# Patient Record
Sex: Female | Born: 1995 | Hispanic: Yes | Marital: Married | Smoking: Never smoker
Health system: Southern US, Community
[De-identification: ages and names within clinical notes are randomized; demographics above are authoritative.]

## PROBLEM LIST (undated history)

## (undated) DIAGNOSIS — Z789 Other specified health status: Secondary | ICD-10-CM

## (undated) DIAGNOSIS — O139 Gestational [pregnancy-induced] hypertension without significant proteinuria, unspecified trimester: Secondary | ICD-10-CM

## (undated) HISTORY — PX: OTHER SURGICAL HISTORY: SHX169

## (undated) HISTORY — DX: Other specified health status: Z78.9

## (undated) HISTORY — PX: NO PAST SURGERIES: SHX2092

---

## 2020-02-19 DIAGNOSIS — F411 Generalized anxiety disorder: Secondary | ICD-10-CM | POA: Insufficient documentation

## 2022-01-06 DIAGNOSIS — Z6838 Body mass index (BMI) 38.0-38.9, adult: Secondary | ICD-10-CM | POA: Insufficient documentation

## 2022-01-06 HISTORY — DX: Body mass index (BMI) 38.0-38.9, adult: Z68.38

## 2022-01-17 ENCOUNTER — Encounter (HOSPITAL_COMMUNITY): Payer: Self-pay | Admitting: Obstetrics & Gynecology

## 2022-01-17 ENCOUNTER — Emergency Department (HOSPITAL_COMMUNITY)
Admission: EM | Admit: 2022-01-17 | Discharge: 2022-01-17 | Discharge status: Absent without leave | Payer: BC Managed Care – PPO | Source: Home / Self Care

## 2022-01-17 ENCOUNTER — Inpatient Hospital Stay (HOSPITAL_COMMUNITY)
Admission: AD | Admit: 2022-01-17 | Discharge: 2022-01-17 | Disposition: A | Discharge status: Home / Self Care | Payer: BC Managed Care – PPO | Attending: Obstetrics & Gynecology | Admitting: Obstetrics & Gynecology

## 2022-01-17 ENCOUNTER — Inpatient Hospital Stay (HOSPITAL_COMMUNITY): Payer: BC Managed Care – PPO

## 2022-01-17 DIAGNOSIS — O26891 Other specified pregnancy related conditions, first trimester: Secondary | ICD-10-CM

## 2022-01-17 DIAGNOSIS — R1032 Left lower quadrant pain: Secondary | ICD-10-CM | POA: Insufficient documentation

## 2022-01-17 DIAGNOSIS — R1030 Lower abdominal pain, unspecified: Secondary | ICD-10-CM

## 2022-01-17 DIAGNOSIS — N898 Other specified noninflammatory disorders of vagina: Secondary | ICD-10-CM | POA: Diagnosis not present

## 2022-01-17 DIAGNOSIS — Z3A13 13 weeks gestation of pregnancy: Secondary | ICD-10-CM | POA: Diagnosis not present

## 2022-01-17 LAB — OB RESULTS CONSOLE HEPATITIS B SURFACE ANTIGEN: Hepatitis B Surface Ag: NEGATIVE

## 2022-01-17 LAB — CBC
HCT: 36 % (ref 36.0–46.0)
Hemoglobin: 12.5 g/dL (ref 12.0–15.0)
MCH: 30.9 pg (ref 26.0–34.0)
MCHC: 34.7 g/dL (ref 30.0–36.0)
MCV: 88.9 fL (ref 80.0–100.0)
Platelets: 244 10*3/uL (ref 150–400)
RBC: 4.05 MIL/uL (ref 3.87–5.11)
RDW: 12.9 % (ref 11.5–15.5)
WBC: 9.1 10*3/uL (ref 4.0–10.5)
nRBC: 0 % (ref 0.0–0.2)

## 2022-01-17 LAB — URINALYSIS, ROUTINE W REFLEX MICROSCOPIC
Bilirubin Urine: NEGATIVE
Glucose, UA: NEGATIVE mg/dL
Hgb urine dipstick: NEGATIVE
Ketones, ur: NEGATIVE mg/dL
Leukocytes,Ua: NEGATIVE
Nitrite: NEGATIVE
Protein, ur: NEGATIVE mg/dL
Specific Gravity, Urine: 1.033 — ABNORMAL HIGH (ref 1.005–1.030)
pH: 5 (ref 5.0–8.0)

## 2022-01-17 LAB — POCT PREGNANCY, URINE: Preg Test, Ur: POSITIVE — AB

## 2022-01-17 LAB — WET PREP, GENITAL
Clue Cells Wet Prep HPF POC: NONE SEEN
Sperm: NONE SEEN
Trich, Wet Prep: NONE SEEN
WBC, Wet Prep HPF POC: <10 (ref <10)
Yeast Wet Prep HPF POC: NONE SEEN

## 2022-01-17 LAB — HCG, QUANTITATIVE, PREGNANCY: hCG, Beta Chain, Quant, S: 77758 m[IU]/mL — ABNORMAL HIGH (ref <5)

## 2022-01-17 LAB — ABO/RH: ABO/RH(D): O POS

## 2022-01-17 NOTE — MAU Note (Addendum)
Jodi Jenkins is a 26 y.o. at [redacted]w[redacted]d here in MAU reporting: abdominal cramping that radiates from the left lower quadrant to the middle that started at 0400 today and right upper back pain that started on Tuesday this week, patient describes it like pulling, pins and needles, and tingling. Patient denies having any vaginal bleeding, but does report having watery, clear vaginal discharge.   Pain score: 7 Vitals:   01/17/22 1829  BP: (!) 120/53  Pulse: 89  Temp: 98.8 F (37.1 C)  SpO2: 100%     Unable to obtain FHR with doppler, patient will have an ultrasound.

## 2022-01-17 NOTE — Discharge Instructions (Signed)

## 2022-01-17 NOTE — MAU Provider Note (Signed)
History     CSN: [id number]  Arrival date and time: 01/17/22 1800   Event Date/Time   First Provider Initiated Contact with Patient 01/17/22 1847      Chief Complaint  Patient presents with   Abdominal Cramping   Back Pain    Right upper back pain   Jodi Jenkins, a  26 y.o. G1P0 at [redacted]w[redacted]d presents to MAU with complaints of left sided lower abdominal cramping that has been going on since 0400 AM this morning. Patient states that she is "afraid to take anything" and has not attempted anything for pain relief. Currently rates pain a 6/10. She also endorses upper right shoulder pain with movement that started Tuesday. Describes as "pulling, pins and needles." She denies vaginal bleeding, but endorses increase in "watery" vaginal discharge since cramping this morning. Denies having to wear a pad or panty liner. States the discharge is colorless and odorless. Denies urinary symptoms. FHT were unable to be heard in triage and when CNM arrived to the room, patient was very tearful.   Patient receives care at [facility] in [place]. Desires to Transfer care to [facility]        OB History     Gravida  1   Para      Term      Preterm      AB      Living         SAB      IAB      Ectopic      Multiple      Live Births              History reviewed. No pertinent past medical history.  History reviewed. No pertinent surgical history.  Family History  Problem Relation Age of Onset   Hypertension Mother    Cancer Maternal Grandmother     Social History   Tobacco Use   Smoking status: Unknown  Vaping Use   Vaping Use: Never used  Substance Use Topics   Alcohol use: Never   Drug use: Never    Allergies: No Known Allergies  No medications prior to admission.    Review of Systems  Gastrointestinal:  Positive for abdominal pain. Negative for constipation, nausea and vomiting.  Genitourinary:  Positive for pelvic pain and vaginal discharge.  Negative for difficulty urinating, flank pain, vaginal bleeding and vaginal pain.  Musculoskeletal:  Negative for back pain.  Neurological:  Negative for light-headedness and headaches.   Physical Exam   Blood pressure 113/67, pulse 91, temperature 98.8 F (37.1 C), temperature source Oral, resp. rate 18, height 5\' 2"  (1.575 m), weight 100.4 kg, last menstrual period 10/01/2021, SpO2 100 %.  Physical Exam Vitals and nursing note reviewed.  Constitutional:      General: She is in acute distress.     Appearance: Normal appearance.  HENT:     Head: Normocephalic.  Pulmonary:     Effort: No respiratory distress.  Abdominal:     Tenderness: There is abdominal tenderness. There is no right CVA tenderness or left CVA tenderness.  Musculoskeletal:     Cervical back: Normal range of motion.  Skin:    General: Skin is warm and dry.  Neurological:     Mental Status: She is alert and oriented to person, place, and time.  Psychiatric:        Mood and Affect: Mood normal.     MAU Course  Procedures Lab Orders  Wet prep, genital         Urinalysis, Routine w reflex microscopic Urine, Clean Catch         CBC         hCG, quantitative, pregnancy         Pregnancy, urine POC    OB Ultrasound also ordered.   Results for orders placed or performed during the hospital encounter of 01/17/22 (from the past 24 hour(s))  Pregnancy, urine POC     Status: Abnormal   Collection Time: 01/17/22  6:16 PM  Result Value Ref Range   Preg Test, Ur POSITIVE (A) NEGATIVE  Urinalysis, Routine w reflex microscopic Urine, Clean Catch     Status: Abnormal   Collection Time: 01/17/22  6:26 PM  Result Value Ref Range   Color, Urine YELLOW YELLOW   APPearance HAZY (A) CLEAR   Specific Gravity, Urine 1.033 (H) 1.005 - 1.030   pH 5.0 5.0 - 8.0   Glucose, UA NEGATIVE NEGATIVE mg/dL   Hgb urine dipstick NEGATIVE NEGATIVE   Bilirubin Urine NEGATIVE NEGATIVE   Ketones, ur NEGATIVE NEGATIVE mg/dL    Protein, ur NEGATIVE NEGATIVE mg/dL   Nitrite NEGATIVE NEGATIVE   Leukocytes,Ua NEGATIVE NEGATIVE  Wet prep, genital     Status: None   Collection Time: 01/17/22  6:54 PM  Result Value Ref Range   Yeast Wet Prep HPF POC NONE SEEN NONE SEEN   Trich, Wet Prep NONE SEEN NONE SEEN   Clue Cells Wet Prep HPF POC NONE SEEN NONE SEEN   WBC, Wet Prep HPF POC <10 <10   Sperm NONE SEEN   CBC     Status: None   Collection Time: 01/17/22  7:29 PM  Result Value Ref Range   WBC 9.1 4.0 - 10.5 K/uL   RBC 4.05 3.87 - 5.11 MIL/uL   Hemoglobin 12.5 12.0 - 15.0 g/dL   HCT 32.4 40.1 - 02.7 %   MCV 88.9 80.0 - 100.0 fL   MCH 30.9 26.0 - 34.0 pg   MCHC 34.7 30.0 - 36.0 g/dL   RDW 25.3 66.4 - 40.3 %   Platelets 244 150 - 400 K/uL   nRBC 0.0 0.0 - 0.2 %  ABO/Rh     Status: None   Collection Time: 01/17/22  7:29 PM  Result Value Ref Range   ABO/RH(D)      O POS Performed at [facility], [address] [place], [place] [address]   hCG, quantitative, pregnancy     Status: Abnormal   Collection Time: 01/17/22  7:29 PM  Result Value Ref Range   hCG, Beta Chain, Quant, S 77,758 (H) <5 mIU/mL   US OB Comp Less 14 Wks  Result Date: 01/17/2022 CLINICAL DATA:  Pain left lower quadrant of abdomen EXAM: OBSTETRIC <14 WK ULTRASOUND TECHNIQUE: Transabdominal ultrasound was performed for evaluation of the gestation as well as the maternal uterus and adnexal regions. COMPARISON:  None Available. FINDINGS: Intrauterine gestational sac: Single Yolk sac:  Not seen Embryo:  Seen Cardiac Activity: Seen Heart Rate: 140 bpm CRL:   80 mm   14 w 0 d                  [place] EDC: 07/18/2022 Placenta is posterior without evidence of previa or abruption. Amount of amniotic fluid around the fetus appears to be normal. Subchorionic hemorrhage:  None visualized. Maternal uterus/adnexae: There is 3.5 cm cyst in the right ovary, possibly functional cyst. Adnexal regions  are otherwise unremarkable. There is no free fluid in the  pelvis. IMPRESSION: Single live intrauterine pregnancy seen. Sonographically estimated gestational age is 14 weeks. 3.5 cm cyst in the right adnexa suggests functional ovarian cyst. There is no free fluid in the pelvis. Electronically Signed   By: Ernie Avena M.D.   On: 01/17/2022 19:59    MDM Labs in MAU were normal.  Ultrasound revealed a normal IUP at ~[redacted] weeks gestation with active cardiac activity.  3.5cm cyst noted in the Right ovary. Low suspicion for threatened miscarriage.   Assessment and Plan   1. Vaginal discharge during pregnancy in first trimester   2. [redacted] weeks gestation of pregnancy   3. Lower abdominal pain    - Normal IUP ~14 weeks. Patient reassured of normal pregnancy and normal lab results.  - Discussed in detail options for pain management including PO Tylenol and Flexeril. Offered to patient, but patient declines at this time.  - Safe medications in pregnancy list provided.  - Discussed that pain may be related to Round ligament pain, or musculoskeletal pain. Stretches and options for comfort provided in discharge instructions. - Patient desires to switch to [facility] for prenatal care. Provider list given.  -  Preterm labor precautions provided.  - Patient discharged home in stable condition.  - Patient may return to MAU as needed.   Claudette Head, CNM  01/17/2022, 10:53 PM

## 2022-01-20 ENCOUNTER — Telehealth: Payer: Self-pay | Admitting: *Deleted

## 2022-01-20 NOTE — Telephone Encounter (Signed)
Left patient an urgent message to call the office prior to scheduled appointment on 02/13/2022 at 10:10 with a 9:55 AM arrival time.

## 2022-02-13 ENCOUNTER — Encounter: Payer: BC Managed Care – PPO | Admitting: Certified Nurse Midwife

## 2022-04-16 ENCOUNTER — Encounter: Payer: Self-pay | Admitting: *Deleted

## 2022-04-16 DIAGNOSIS — Z3490 Encounter for supervision of normal pregnancy, unspecified, unspecified trimester: Secondary | ICD-10-CM

## 2022-04-16 HISTORY — DX: Encounter for supervision of normal pregnancy, unspecified, unspecified trimester: Z34.90

## 2022-04-17 ENCOUNTER — Encounter: Payer: Self-pay | Admitting: Advanced Practice Midwife

## 2022-04-17 ENCOUNTER — Ambulatory Visit (INDEPENDENT_AMBULATORY_CARE_PROVIDER_SITE_OTHER): Payer: BC Managed Care – PPO | Admitting: Advanced Practice Midwife

## 2022-04-17 VITALS — BP 118/79 | HR 83 | Wt 243.0 lb

## 2022-04-17 DIAGNOSIS — O133 Gestational [pregnancy-induced] hypertension without significant proteinuria, third trimester: Secondary | ICD-10-CM | POA: Diagnosis not present

## 2022-04-17 DIAGNOSIS — M549 Dorsalgia, unspecified: Secondary | ICD-10-CM

## 2022-04-17 DIAGNOSIS — O99212 Obesity complicating pregnancy, second trimester: Secondary | ICD-10-CM | POA: Insufficient documentation

## 2022-04-17 DIAGNOSIS — Z3A26 26 weeks gestation of pregnancy: Secondary | ICD-10-CM

## 2022-04-17 DIAGNOSIS — O0993 Supervision of high risk pregnancy, unspecified, third trimester: Secondary | ICD-10-CM | POA: Diagnosis not present

## 2022-04-17 DIAGNOSIS — O0990 Supervision of high risk pregnancy, unspecified, unspecified trimester: Secondary | ICD-10-CM | POA: Insufficient documentation

## 2022-04-17 DIAGNOSIS — O99891 Other specified diseases and conditions complicating pregnancy: Secondary | ICD-10-CM

## 2022-04-17 DIAGNOSIS — O2602 Excessive weight gain in pregnancy, second trimester: Secondary | ICD-10-CM

## 2022-04-17 DIAGNOSIS — O0992 Supervision of high risk pregnancy, unspecified, second trimester: Secondary | ICD-10-CM

## 2022-04-17 DIAGNOSIS — Z3A35 35 weeks gestation of pregnancy: Secondary | ICD-10-CM | POA: Diagnosis not present

## 2022-04-17 NOTE — Progress Notes (Signed)
Subjective:   Jodi Jenkins is a 26 y.o. G1P0 at [redacted]w[redacted]d by early ultrasound being seen today for her first obstetrical visit.  Her obstetrical history is significant for excessive weight gain and has Supervision of high risk pregnancy, antepartum; GAD (generalized anxiety disorder); BMI 38.0-38.9,adult; and Obesity affecting pregnancy in second trimester on their problem list.. Patient does intend to breast feed. Pregnancy history fully reviewed.  Patient reports backache.  HISTORY: OB History  Gravida Para Term Preterm AB Living  1 0 0 0 0 0  SAB IAB Ectopic Multiple Live Births  0 0 0 0 0    # Outcome Date GA Lbr Len/2nd Weight Sex Delivery Anes PTL Lv  1 Current            Past Medical History:  Diagnosis Date  . Supervision of normal pregnancy 04/16/2022    Nursing Staff Provider Office Location   Dating  07/22/2022, by Other Basis PNC Model [ ]  Traditional [ ]  Centering [ ]  Mom-Baby Dyad Anatomy   Language    Flu Vaccine   Genetic/Carrier Screen  NIPS:  Neg  AFP: Neg   Horizon: TDaP Vaccine    Hgb A1C or  GTT Early  Third trimester  COVID Vaccine    LAB RESULTS  Rhogam     Blood Type   Opos Baby Feeding Plan  Antibody  Ne   History reviewed. No pertinent surgical history. Family History  Problem Relation Age of Onset  . Hypertension Mother   . Cancer Maternal Grandmother    Social History   Tobacco Use  . Smoking status: Unknown  Vaping Use  . Vaping Use: Never used  Substance Use Topics  . Alcohol use: Never  . Drug use: Never   No Known Allergies Current Outpatient Medications on File Prior to Visit  Medication Sig Dispense Refill  . aspirin EC 81 MG tablet Take 81 mg by mouth daily. Swallow whole.    . prenatal vitamin w/FE, FA (PRENATAL 1 + 1) 27-1 MG TABS tablet Take 1 tablet by mouth daily at 12 noon.     No current facility-administered medications on file prior to visit.    Exam   Vitals:   04/17/22 1034  BP: 118/79  Pulse: 83  Weight: 243  lb (110.2 kg)   Fetal Heart Rate (bpm): 143  VS reviewed, nursing note reviewed,  Constitutional: well developed, well nourished, no distress HEENT: normocephalic CV: normal rate Pulm/chest wall: normal effort Abdomen: soft Neuro: alert and oriented x 3 Skin: warm, dry Psych: affect normal    Assessment:   Pregnancy: G1P0 Patient Active Problem List   Diagnosis Date Noted  . Supervision of high risk pregnancy, antepartum 04/17/2022  . Obesity affecting pregnancy in second trimester 04/17/2022  . BMI 38.0-38.9,adult 01/06/2022  . GAD (generalized anxiety disorder) 02/19/2020     Plan:  1. Supervision of high risk pregnancy in second trimester -High risk for BMI --Anticipatory guidance about next visits/weeks of pregnancy given.   2. [redacted] weeks gestation of pregnancy   3. Obesity affecting pregnancy in second trimester --Taking BASA - 08-15-1974 MFM OB DETAIL +14 WK; Future  4. Excess weight gain in pregnancy, second trimester --Pt reports 40 lb weight gain at 26 weeks --Discussed dietary changes, increased lean meats and vegetables, decreased high calorie carbohydrate foods.  Add daily walk to schedule. ---Pt taking Phentermine before pregnancy and lost 40 lbs so thinks this may be weight gain after stopping medication  -  [place] MFM OB DETAIL +14 WK; Future  5. Back pain affecting pregnancy in second trimester --Rest/ice/heat/warm bath/increase PO fluids/Tylenol/pregnancy support belt      Initial labs drawn. Continue prenatal vitamins. Reviewed previous labs  Ultrasound discussed; fetal anatomic survey:  results reviewed and [place] ordered to follow growth for BMI . Problem list reviewed and updated. The nature of [facility] with multiple MDs and other Advanced Practice Providers was explained to patient; also emphasized that residents, students are part of our team. Routine obstetric precautions reviewed. Return in about 2 weeks (around  05/01/2022) for GTT at next visit, Any provider.   Sharen Counter, CNM 04/17/22 12:30 PM

## 2022-04-21 ENCOUNTER — Encounter (INDEPENDENT_AMBULATORY_CARE_PROVIDER_SITE_OTHER): Payer: Self-pay | Admitting: *Deleted

## 2022-04-21 NOTE — Progress Notes (Signed)
FMLA filled out and charges dropped

## 2022-04-22 ENCOUNTER — Telehealth: Payer: Self-pay | Admitting: *Deleted

## 2022-04-22 NOTE — Telephone Encounter (Signed)
Returned call from 11:14 AM. Left patient a message with Tea information for scheduling earlier appointment. Patient can call [facility] daily for cancellations.

## 2022-05-01 ENCOUNTER — Ambulatory Visit (INDEPENDENT_AMBULATORY_CARE_PROVIDER_SITE_OTHER): Payer: BC Managed Care – PPO | Admitting: Certified Nurse Midwife

## 2022-05-01 VITALS — BP 114/76 | HR 91 | Wt 245.0 lb

## 2022-05-01 DIAGNOSIS — O0992 Supervision of high risk pregnancy, unspecified, second trimester: Secondary | ICD-10-CM

## 2022-05-01 DIAGNOSIS — Z3A28 28 weeks gestation of pregnancy: Secondary | ICD-10-CM | POA: Diagnosis not present

## 2022-05-01 DIAGNOSIS — O0990 Supervision of high risk pregnancy, unspecified, unspecified trimester: Secondary | ICD-10-CM

## 2022-05-01 DIAGNOSIS — O99212 Obesity complicating pregnancy, second trimester: Secondary | ICD-10-CM | POA: Diagnosis not present

## 2022-05-01 LAB — OB RESULTS CONSOLE RUBELLA ANTIBODY, IGM: Rubella: IMMUNE

## 2022-05-01 NOTE — Progress Notes (Signed)
Subjective:  Jodi Jenkins is a 26 y.o. G1P0 at [redacted]w[redacted]d being seen today for ongoing prenatal care.  She is currently monitored for the following issues for this low-risk pregnancy and has Supervision of high risk pregnancy, antepartum; GAD (generalized anxiety disorder); BMI 38.0-38.9,adult; and Obesity affecting pregnancy in second trimester on their problem list.  Patient reports no complaints.  Contractions: Not present. Vag. Bleeding: None.  Movement: Present. Denies leaking of fluid.   The following portions of the patient's history were reviewed and updated as appropriate: allergies, current medications, past family history, past medical history, past social history, past surgical history and problem list. Problem list updated.  Objective:   Vitals:   05/01/22 0815  BP: 114/76  Pulse: 91  Weight: 245 lb (111.1 kg)    Fetal Status: Fetal Heart Rate (bpm): 159 Fundal Height: 31 cm Movement: Present     General:  Alert, oriented and cooperative. Patient is in no acute distress.  Skin: Skin is warm and dry. No rash noted.   Cardiovascular: Normal heart rate noted  Respiratory: Normal respiratory effort, no problems with respiration noted  Abdomen: Soft, gravid, appropriate for gestational age. Pain/Pressure: Absent     Pelvic: Vag. Bleeding: None Vag D/C Character: Thin   Cervical exam deferred        Extremities: Normal range of motion.  Edema: None  Mental Status: Normal mood and affect. Normal behavior. Normal judgment and thought content.   Urinalysis:      Assessment and Plan:  Pregnancy: G1P0 at [redacted]w[redacted]d  1. Supervision of high risk pregnancy, antepartum  - 2Hr GTT w/ 1 Hr Carpenter 75 g - HIV antibody (with reflex) - CBC - RPR  2. Obesity affecting pregnancy in third trimester -start AT at 32w  3. [redacted] weeks gestation of pregnancy   Preterm labor symptoms and general obstetric precautions including but not limited to vaginal bleeding, contractions, leaking of  fluid and fetal movement were reviewed in detail with the patient. Please refer to After Visit Summary for other counseling recommendations.  Return in about 2 weeks (around 05/15/2022).   Julianne Handler, CNM

## 2022-05-01 NOTE — Progress Notes (Signed)
Pt will do Tdap at next visit

## 2022-05-04 LAB — CBC
HCT: 37.3 % (ref 35.0–45.0)
Hemoglobin: 12.6 g/dL (ref 11.7–15.5)
MCH: 29.6 pg (ref 27.0–33.0)
MCHC: 33.8 g/dL (ref 32.0–36.0)
MCV: 87.8 fL (ref 80.0–100.0)
MPV: 11.1 fL (ref 7.5–12.5)
Platelets: 278 10*3/uL (ref 140–400)
RBC: 4.25 10*6/uL (ref 3.80–5.10)
RDW: 12.8 % (ref 11.0–15.0)
WBC: 7.5 10*3/uL (ref 3.8–10.8)

## 2022-05-04 LAB — 2HR GTT W 1 HR, CARPENTER, 75 G
Glucose, 1 Hr, Gest: 148 mg/dL (ref 65–179)
Glucose, 2 Hr, Gest: 96 mg/dL (ref 65–152)
Glucose, Fasting, Gest: 75 mg/dL (ref 65–91)

## 2022-05-04 LAB — HIV ANTIBODY (ROUTINE TESTING W REFLEX): HIV 1&2 Ab, 4th Generation: NONREACTIVE

## 2022-05-04 LAB — RPR: RPR Ser Ql: NONREACTIVE

## 2022-05-19 ENCOUNTER — Other Ambulatory Visit: Payer: Self-pay | Admitting: *Deleted

## 2022-05-19 ENCOUNTER — Ambulatory Visit (INDEPENDENT_AMBULATORY_CARE_PROVIDER_SITE_OTHER): Payer: BC Managed Care – PPO

## 2022-05-19 ENCOUNTER — Ambulatory Visit: Payer: BC Managed Care – PPO | Attending: Advanced Practice Midwife

## 2022-05-19 ENCOUNTER — Ambulatory Visit: Payer: BC Managed Care – PPO | Admitting: *Deleted

## 2022-05-19 VITALS — BP 128/78 | HR 85 | Wt 253.0 lb

## 2022-05-19 VITALS — BP 125/74 | HR 85

## 2022-05-19 DIAGNOSIS — O26843 Uterine size-date discrepancy, third trimester: Secondary | ICD-10-CM | POA: Diagnosis not present

## 2022-05-19 DIAGNOSIS — O99213 Obesity complicating pregnancy, third trimester: Secondary | ICD-10-CM

## 2022-05-19 DIAGNOSIS — O0990 Supervision of high risk pregnancy, unspecified, unspecified trimester: Secondary | ICD-10-CM

## 2022-05-19 DIAGNOSIS — Z23 Encounter for immunization: Secondary | ICD-10-CM | POA: Diagnosis not present

## 2022-05-19 DIAGNOSIS — Z363 Encounter for antenatal screening for malformations: Secondary | ICD-10-CM

## 2022-05-19 DIAGNOSIS — Z3493 Encounter for supervision of normal pregnancy, unspecified, third trimester: Secondary | ICD-10-CM

## 2022-05-19 DIAGNOSIS — O2602 Excessive weight gain in pregnancy, second trimester: Secondary | ICD-10-CM | POA: Diagnosis present

## 2022-05-19 DIAGNOSIS — E669 Obesity, unspecified: Secondary | ICD-10-CM

## 2022-05-19 DIAGNOSIS — O0993 Supervision of high risk pregnancy, unspecified, third trimester: Secondary | ICD-10-CM

## 2022-05-19 DIAGNOSIS — O99212 Obesity complicating pregnancy, second trimester: Secondary | ICD-10-CM | POA: Insufficient documentation

## 2022-05-19 DIAGNOSIS — O2603 Excessive weight gain in pregnancy, third trimester: Secondary | ICD-10-CM

## 2022-05-19 DIAGNOSIS — Z3A30 30 weeks gestation of pregnancy: Secondary | ICD-10-CM

## 2022-05-19 NOTE — Progress Notes (Signed)
   PRENATAL VISIT NOTE  Subjective:  Jodi Jenkins is a 26 y.o. G1P0 at [redacted]w[redacted]d being seen today for ongoing prenatal care.  She is currently monitored for the following issues for this high-risk pregnancy and has Supervision of high risk pregnancy, antepartum; GAD (generalized anxiety disorder); BMI 38.0-38.9,adult; and Obesity affecting pregnancy in second trimester on their problem list.  Patient reports no complaints.  Contractions: Not present. Vag. Bleeding: None.  Movement: Present. Denies leaking of fluid.   The following portions of the patient's history were reviewed and updated as appropriate: allergies, current medications, past family history, past medical history, past social history, past surgical history and problem list.   Objective:   Vitals:   05/19/22 1451  BP: 128/78  Pulse: 85  Weight: 253 lb (114.8 kg)    Fetal Status: Fetal Heart Rate (bpm): 138 Fundal Height: 31 cm Movement: Present     General:  Alert, oriented and cooperative. Patient is in no acute distress.  Skin: Skin is warm and dry. No rash noted.   Cardiovascular: Normal heart rate noted  Respiratory: Normal respiratory effort, no problems with respiration noted  Abdomen: Soft, gravid, appropriate for gestational age.  Pain/Pressure: Absent     Pelvic: Cervical exam deferred        Extremities: Normal range of motion.  Edema: Trace  Mental Status: Normal mood and affect. Normal behavior. Normal judgment and thought content.   Assessment and Plan:  Pregnancy: G1P0 at [redacted]w[redacted]d 1. Supervision of high risk pregnancy, antepartum - Routine OB. Doing well, no concerns - Tdap given today - Anticipatory guidance for upcoming appointments provided - Tdap vaccine greater than or equal to 7yo IM  2. [redacted] weeks gestation of pregnancy - Endorses active fetal movement  3. Obesity affecting pregnancy in third trimester, unspecified obesity type - Continue bASA - ANT per MFM recommendations. F/u US on  11/17   Preterm labor symptoms and general obstetric precautions including but not limited to vaginal bleeding, contractions, leaking of fluid and fetal movement were reviewed in detail with the patient. Please refer to After Visit Summary for other counseling recommendations.   Return in about 2 weeks (around 06/02/2022).  Future Appointments  Date Time Provider [place]  05/29/2022  9:50 AM Rasch, Artist Pais, NP [facility]  06/12/2022  8:50 AM Rasch, Artist Pais, NP [facility]  06/19/2022  9:30 AM [facility] NURSE [facility]  06/19/2022  9:45 AM [facility] US5 [facility]  07/02/2022  3:10 PM Radene Gunning, [place] Tacia Hindley, CNM

## 2022-05-25 ENCOUNTER — Encounter (HOSPITAL_COMMUNITY): Payer: Self-pay | Admitting: Family Medicine

## 2022-05-25 ENCOUNTER — Inpatient Hospital Stay (HOSPITAL_COMMUNITY)
Admission: AD | Admit: 2022-05-25 | Discharge: 2022-05-25 | Disposition: A | Discharge status: Home / Self Care | Payer: BC Managed Care – PPO | Attending: Family Medicine | Admitting: Family Medicine

## 2022-05-25 DIAGNOSIS — R109 Unspecified abdominal pain: Secondary | ICD-10-CM | POA: Insufficient documentation

## 2022-05-25 DIAGNOSIS — R519 Headache, unspecified: Secondary | ICD-10-CM | POA: Insufficient documentation

## 2022-05-25 DIAGNOSIS — O368130 Decreased fetal movements, third trimester, not applicable or unspecified: Secondary | ICD-10-CM

## 2022-05-25 DIAGNOSIS — O26893 Other specified pregnancy related conditions, third trimester: Secondary | ICD-10-CM | POA: Diagnosis not present

## 2022-05-25 DIAGNOSIS — Z3A31 31 weeks gestation of pregnancy: Secondary | ICD-10-CM | POA: Diagnosis not present

## 2022-05-25 DIAGNOSIS — Z3689 Encounter for other specified antenatal screening: Secondary | ICD-10-CM

## 2022-05-25 NOTE — MAU Note (Signed)
Jodi Jenkins is a 26 y.o. at [redacted]w[redacted]d here in MAU reporting: baby has not been moving, very minimal since 1900 yesterday.  Maybe moved twice since then. Some sharp pain in lower abd. Little bit of water leaking- "always has it", doesn't saturate underwear. No bleeding.   Onset of complaint: last night Pain score: minimal, 4 or 5 Vitals:   05/25/22 1354  BP: 132/77  Pulse: 67  Resp: 18  Temp: 98.2 F (36.8 C)  SpO2: 100%     FHT:136 Lab orders placed from triage:  none

## 2022-05-25 NOTE — MAU Provider Note (Signed)
History     CSN: [id number]  Arrival date and time: 05/25/22 1329   Event Date/Time   First Provider Initiated Contact with Patient 05/25/22 1452      Chief Complaint  Patient presents with   Decreased Fetal Movement   Abdominal Pain   Jodi Jenkins is a 26 y.o. G1P0 at [redacted]w[redacted]d who receives care at [facility].  She presents today for Decreased Fetal Movement.  Patient states she has not felt decreased movement since 6 or 7 AM.  She states she did not detect any movement between 7 and 11 PM.  However she states around 1 AM she experienced some movement.  She states she has attempted pushing on abdomen, position change, and eating with little to no improvement.  Patient does endorse movement since arrival at hospital.  Patient denies abdominal cramping or contractions, but does report a "sharp pain" that she has been experiencing throughout the pregnancy.  She states the pain last 1 to 2 minutes but she is not concerned.  She also reports some vaginal discharge that has been present throughout the pregnancy also of which she is not concerned.  Patient states she has a headache since arrival to hospital, that she describes as a heaviness and rates it a 5/10.  She declines pain medication and contributes this pain to her position.    OB History     Gravida  1   Para      Term      Preterm      AB      Living         SAB      IAB      Ectopic      Multiple      Live Births              Past Medical History:  Diagnosis Date   Supervision of normal pregnancy 04/16/2022    Nursing Staff Provider Office Location   Dating  07/22/2022, by Other Basis PNC Model [ ]  Traditional [ ]  Centering [ ]  Mom-Baby Dyad Anatomy US   Language  English   Flu Vaccine   Genetic/Carrier Screen  NIPS:  Neg  AFP: Neg   Horizon: TDaP Vaccine    Hgb A1C or  GTT Early  Third trimester  COVID Vaccine    LAB RESULTS  Rhogam     Blood Type   Opos Baby Feeding Plan  Antibody  Ne    History  reviewed. No pertinent surgical history.  Family History  Problem Relation Age of Onset   Hypertension Mother    Cancer Maternal Grandmother     Social History   Tobacco Use   Smoking status: Unknown  Vaping Use   Vaping Use: Never used  Substance Use Topics   Alcohol use: Never   Drug use: Never    Allergies: No Known Allergies  Medications Prior to Admission  Medication Sig Dispense Refill Last Dose   aspirin EC 81 MG tablet Take 81 mg by mouth daily. Swallow whole.      prenatal vitamin w/FE, FA (PRENATAL 1 + 1) 27-1 MG TABS tablet Take 1 tablet by mouth daily at 12 noon.       Review of Systems  Gastrointestinal:  Positive for abdominal pain (Intermittent cramping). Negative for nausea and vomiting.  Genitourinary:  Positive for vaginal discharge. Negative for difficulty urinating, dysuria and vaginal bleeding.  Neurological:  Positive for headaches. Negative for dizziness and light-headedness.   Physical  Exam   Blood pressure 132/77, pulse 67, temperature 98.2 F (36.8 C), temperature source Oral, resp. rate 18, height 5\' 2"  (1.575 m), weight 113.5 kg, last menstrual period 10/01/2021, SpO2 100 %.  Physical Exam Vitals reviewed.  Constitutional:      General: She is not in acute distress.    Appearance: She is well-developed. She is obese. She is not ill-appearing or toxic-appearing.  HENT:     Head: Normocephalic and atraumatic.  Eyes:     Conjunctiva/sclera: Conjunctivae normal.  Cardiovascular:     Rate and Rhythm: Normal rate.  Pulmonary:     Effort: Pulmonary effort is normal. No respiratory distress.  Abdominal:     Comments: Gravid  Musculoskeletal:        General: Normal range of motion.     Cervical back: Normal range of motion.  Skin:    General: Skin is warm and dry.  Neurological:     Mental Status: She is alert and oriented to person, place, and time.  Psychiatric:        Mood and Affect: Mood normal.        Behavior: Behavior normal.      Fetal Assessment 145 bpm, Mod Var, -Decels, +Accels Toco: No CTX graph  MAU Course  No results found for this or any previous visit (from the past 24 hour(s)). No results found.  MDM PE Labs: None EFM  Assessment and Plan  26 year old G1P0  SIUP at 31.5 weeks Cat I FT DFM   -POC Reviewed. -Discussed measures to take to promote fetal movement when decreased.  -Discussed anticipated changes in fetal movement with growing fetus in relation to uterine size. -NST assessing, but appears reassuring. -Continue to monitor and plan for discharge with reactive tracing.   Maryann Conners MSN, CNM 05/25/2022, 2:52 PM    Reassessment (3:16 PM)  -NST reactive ->/= 10 movements documented within 30 minutes span.  -Nurse to give discharge teaching and return precautions. -Encouraged to call primary office or return to MAU if symptoms worsen or with the onset of new symptoms. -Discharged to home in stable condition.  Maryann Conners MSN, CNM Advanced Practice Provider, [facility] [employer] [facility]

## 2022-05-29 ENCOUNTER — Ambulatory Visit (INDEPENDENT_AMBULATORY_CARE_PROVIDER_SITE_OTHER): Payer: BC Managed Care – PPO | Admitting: Obstetrics and Gynecology

## 2022-05-29 DIAGNOSIS — O0990 Supervision of high risk pregnancy, unspecified, unspecified trimester: Secondary | ICD-10-CM

## 2022-05-29 DIAGNOSIS — Z3A32 32 weeks gestation of pregnancy: Secondary | ICD-10-CM

## 2022-05-29 DIAGNOSIS — O0993 Supervision of high risk pregnancy, unspecified, third trimester: Secondary | ICD-10-CM

## 2022-05-29 NOTE — Progress Notes (Signed)
   PRENATAL VISIT NOTE  Subjective:  Jodi Jenkins is a 26 y.o. G1P0 at [redacted]w[redacted]d being seen today for ongoing prenatal care.  She is currently monitored for the following issues for this low-risk pregnancy and has Supervision of high risk pregnancy, antepartum; GAD (generalized anxiety disorder); BMI 38.0-38.9,adult; and Obesity affecting pregnancy in second trimester on their problem list.  Patient reports no complaints.  Contractions: Not present. Vag. Bleeding: None.  Movement: Present. Denies leaking of fluid.   The following portions of the patient's history were reviewed and updated as appropriate: allergies, current medications, past family history, past medical history, past social history, past surgical history and problem list.   Objective:   Vitals:   05/29/22 0957  BP: 127/84  Pulse: 90  Weight: 252 lb (114.3 kg)    Fetal Status: Fetal Heart Rate (bpm): 149   Movement: Present     General:  Alert, oriented and cooperative. Patient is in no acute distress.  Skin: Skin is warm and dry. No rash noted.   Cardiovascular: Normal heart rate noted  Respiratory: Normal respiratory effort, no problems with respiration noted  Abdomen: Soft, gravid, appropriate for gestational age.  Pain/Pressure: Absent     Pelvic: Cervical exam deferred        Extremities: Normal range of motion.  Edema: Trace  Mental Status: Normal mood and affect. Normal behavior. Normal judgment and thought content.   Assessment and Plan:  Pregnancy: G1P0 at [redacted]w[redacted]d 1. Supervision of high risk pregnancy, antepartum  Interested in WB. Reviewed class and how to sign up Will have her meet with CNM Doing well Normal 2 hour GTT  Has follow up with MFM   Preterm labor symptoms and general obstetric precautions including but not limited to vaginal bleeding, contractions, leaking of fluid and fetal movement were reviewed in detail with the patient. Please refer to After Visit Summary for other counseling  recommendations.   No follow-ups on file.  Future Appointments  Date Time Provider [place]  06/12/2022  8:50 AM Danee Soller, Artist Pais, NP [facility]  06/19/2022  9:30 AM [facility] NURSE [facility]  06/19/2022  9:45 AM [facility] US5 [facility]  07/02/2022  3:10 PM Radene Gunning, MD [place], NP

## 2022-06-12 ENCOUNTER — Ambulatory Visit (INDEPENDENT_AMBULATORY_CARE_PROVIDER_SITE_OTHER): Payer: BC Managed Care – PPO | Admitting: Obstetrics and Gynecology

## 2022-06-12 VITALS — BP 132/89 | HR 80 | Wt 258.0 lb

## 2022-06-12 DIAGNOSIS — O0993 Supervision of high risk pregnancy, unspecified, third trimester: Secondary | ICD-10-CM

## 2022-06-12 DIAGNOSIS — Z3A34 34 weeks gestation of pregnancy: Secondary | ICD-10-CM

## 2022-06-12 DIAGNOSIS — O0990 Supervision of high risk pregnancy, unspecified, unspecified trimester: Secondary | ICD-10-CM

## 2022-06-12 DIAGNOSIS — Z6838 Body mass index (BMI) 38.0-38.9, adult: Secondary | ICD-10-CM

## 2022-06-12 NOTE — Progress Notes (Signed)
   PRENATAL VISIT NOTE  Subjective:  Jodi Jenkins is a 26 y.o. G1P0 at [redacted]w[redacted]d being seen today for ongoing prenatal care.  She is currently monitored for the following issues for this low-risk pregnancy and has Supervision of high risk pregnancy, antepartum; GAD (generalized anxiety disorder); BMI 38.0-38.9,adult; and Obesity affecting pregnancy in second trimester on their problem list.  Patient reports no complaints.  Contractions: Not present. Vag. Bleeding: None.  Movement: Present. Denies leaking of fluid.   The following portions of the patient's history were reviewed and updated as appropriate: allergies, current medications, past family history, past medical history, past social history, past surgical history and problem list.   Objective:   Vitals:   06/12/22 0901  BP: 132/89  Pulse: 80  Weight: 258 lb (117 kg)    Fetal Status: Fetal Heart Rate (bpm): 156 Fundal Height: 36 cm Movement: Present     General:  Alert, oriented and cooperative. Patient is in no acute distress.  Skin: Skin is warm and dry. No rash noted.   Cardiovascular: Normal heart rate noted  Respiratory: Normal respiratory effort, no problems with respiration noted  Abdomen: Soft, gravid, appropriate for gestational age.  Pain/Pressure: Absent     Pelvic: Cervical exam deferred        Extremities: Normal range of motion.     Mental Status: Normal mood and affect. Normal behavior. Normal judgment and thought content.   Assessment and Plan:  Pregnancy: G1P0 at [redacted]w[redacted]d  1. Supervision of high risk pregnancy, antepartum  Doing well Taking BASA Watch BP.   2. BMI 38.0-38.9,adult  Keep growth [place] on 11/17 with MFM  Preterm labor symptoms and general obstetric precautions including but not limited to vaginal bleeding, contractions, leaking of fluid and fetal movement were reviewed in detail with the patient. Please refer to After Visit Summary for other counseling recommendations.   No follow-ups on  file.  Future Appointments  Date Time Provider Department Center  06/19/2022  9:30 AM [facility] NURSE [facility]  06/19/2022  9:45 AM [facility] US5 [facility]  07/02/2022  3:10 PM Milas Hock, MD [facility]  07/09/2022  3:50 PM Milas Hock, MD [facility]  07/16/2022  3:50 PM Anyanwu, Jethro Bastos, MD [facility]  07/22/2022  1:30 PM Lennart Pall, MD [facility]    Venia Carbon, NP

## 2022-06-19 ENCOUNTER — Ambulatory Visit (HOSPITAL_BASED_OUTPATIENT_CLINIC_OR_DEPARTMENT_OTHER): Payer: BC Managed Care – PPO

## 2022-06-19 ENCOUNTER — Ambulatory Visit: Payer: BC Managed Care – PPO | Admitting: *Deleted

## 2022-06-19 ENCOUNTER — Other Ambulatory Visit: Payer: Self-pay | Admitting: *Deleted

## 2022-06-19 ENCOUNTER — Encounter (HOSPITAL_COMMUNITY): Payer: Self-pay | Admitting: Obstetrics and Gynecology

## 2022-06-19 ENCOUNTER — Inpatient Hospital Stay (HOSPITAL_COMMUNITY)
Admission: AD | Admit: 2022-06-19 | Discharge: 2022-06-19 | Disposition: A | Discharge status: Home / Self Care | Payer: BC Managed Care – PPO | Attending: Obstetrics and Gynecology | Admitting: Obstetrics and Gynecology

## 2022-06-19 VITALS — BP 137/77 | HR 69

## 2022-06-19 DIAGNOSIS — R519 Headache, unspecified: Secondary | ICD-10-CM | POA: Diagnosis not present

## 2022-06-19 DIAGNOSIS — R638 Other symptoms and signs concerning food and fluid intake: Secondary | ICD-10-CM

## 2022-06-19 DIAGNOSIS — O10913 Unspecified pre-existing hypertension complicating pregnancy, third trimester: Secondary | ICD-10-CM | POA: Insufficient documentation

## 2022-06-19 DIAGNOSIS — O0990 Supervision of high risk pregnancy, unspecified, unspecified trimester: Secondary | ICD-10-CM

## 2022-06-19 DIAGNOSIS — O99213 Obesity complicating pregnancy, third trimester: Secondary | ICD-10-CM | POA: Insufficient documentation

## 2022-06-19 DIAGNOSIS — Z3493 Encounter for supervision of normal pregnancy, unspecified, third trimester: Secondary | ICD-10-CM | POA: Insufficient documentation

## 2022-06-19 DIAGNOSIS — O99413 Diseases of the circulatory system complicating pregnancy, third trimester: Secondary | ICD-10-CM | POA: Insufficient documentation

## 2022-06-19 DIAGNOSIS — R030 Elevated blood-pressure reading, without diagnosis of hypertension: Secondary | ICD-10-CM

## 2022-06-19 DIAGNOSIS — O26893 Other specified pregnancy related conditions, third trimester: Secondary | ICD-10-CM

## 2022-06-19 DIAGNOSIS — Z3A35 35 weeks gestation of pregnancy: Secondary | ICD-10-CM | POA: Insufficient documentation

## 2022-06-19 DIAGNOSIS — O26843 Uterine size-date discrepancy, third trimester: Secondary | ICD-10-CM

## 2022-06-19 LAB — PROTEIN / CREATININE RATIO, URINE
Creatinine, Urine: 144 mg/dL
Protein Creatinine Ratio: 0.14 mg/mg{Cre} (ref 0.00–0.15)
Total Protein, Urine: 20 mg/dL

## 2022-06-19 LAB — COMPREHENSIVE METABOLIC PANEL
ALT: 10 U/L (ref 0–44)
AST: 15 U/L (ref 15–41)
Albumin: 2.5 g/dL — ABNORMAL LOW (ref 3.5–5.0)
Alkaline Phosphatase: 138 U/L — ABNORMAL HIGH (ref 38–126)
Anion gap: 10 (ref 5–15)
BUN: 9 mg/dL (ref 6–20)
CO2: 18 mmol/L — ABNORMAL LOW (ref 22–32)
Calcium: 8.9 mg/dL (ref 8.9–10.3)
Chloride: 107 mmol/L (ref 98–111)
Creatinine, Ser: 0.53 mg/dL (ref 0.44–1.00)
GFR, Estimated: >60 mL/min (ref >60)
Glucose, Bld: 83 mg/dL (ref 70–99)
Potassium: 3.9 mmol/L (ref 3.5–5.1)
Sodium: 135 mmol/L (ref 135–145)
Total Bilirubin: 0.2 mg/dL — ABNORMAL LOW (ref 0.3–1.2)
Total Protein: 6.1 g/dL — ABNORMAL LOW (ref 6.5–8.1)

## 2022-06-19 LAB — CBC
HCT: 37.7 % (ref 36.0–46.0)
Hemoglobin: 12.3 g/dL (ref 12.0–15.0)
MCH: 28.5 pg (ref 26.0–34.0)
MCHC: 32.6 g/dL (ref 30.0–36.0)
MCV: 87.5 fL (ref 80.0–100.0)
Platelets: 261 10*3/uL (ref 150–400)
RBC: 4.31 MIL/uL (ref 3.87–5.11)
RDW: 13.4 % (ref 11.5–15.5)
WBC: 9.1 10*3/uL (ref 4.0–10.5)
nRBC: 0 % (ref 0.0–0.2)

## 2022-06-19 MED ORDER — ACETAMINOPHEN 500 MG PO TABS
1000.0000 mg | ORAL_TABLET | Freq: Once | ORAL | Status: AC
  Administered 2022-06-19: 1000 mg via ORAL
  Filled 2022-06-19: qty 2

## 2022-06-19 NOTE — MAU Provider Note (Signed)
History     CSN: [id number]  Arrival date and time: 06/19/22 0013   Event Date/Time   First Provider Initiated Contact with Patient 06/19/22 0145      Chief Complaint  Patient presents with   Hypertension   HPI  Jodi Jenkins is a 26 y.o. female G1P0 @ [redacted]w[redacted]d with with HA and concerns about her blood pressure. She is a Medical laboratory scientific officer patient. Her BP has been borderline high for a few visits. No BPs have been 140/90 or higher. She took her BP at home today and it read 147/107. She reports good FM. She reports a frontal HA that started yesterday. Has not taken anything for the pain. The Gaylyn Rong is constant. She has no visual changes. She reports 7/10 HA currently.   OB History     Gravida  1   Para      Term      Preterm      AB      Living         SAB      IAB      Ectopic      Multiple      Live Births              Past Medical History:  Diagnosis Date   Medical history non-contributory    Supervision of normal pregnancy 04/16/2022    Nursing Staff Provider Office Location   Dating  07/22/2022, by Other Basis [facility] Model [ ]  Traditional [ ]  Centering [ ]  Mom-Baby Dyad Anatomy   Language    Flu Vaccine   Genetic/Carrier Screen  NIPS:  Neg  AFP: Neg   Horizon: TDaP Vaccine    Hgb A1C or  GTT Early  Third trimester  COVID Vaccine    LAB RESULTS  Rhogam     Blood Type   Opos Baby Feeding Plan  Antibody  Ne    History reviewed. No pertinent surgical history.  Family History  Problem Relation Age of Onset   Hypertension Mother    Cancer Maternal Grandmother     Social History   Tobacco Use   Smoking status: Unknown  Vaping Use   Vaping Use: Never used  Substance Use Topics   Alcohol use: Never   Drug use: Never    Allergies: No Known Allergies  No medications prior to admission.   No results found for this or any previous visit (from the past 48 hour(s)).    Review of Systems  Constitutional:  Negative for fever.  Eyes:  Negative for  photophobia and visual disturbance.  Gastrointestinal:  Negative for abdominal pain.  Genitourinary:  Negative for vaginal bleeding.  Neurological:  Positive for headaches.   Physical Exam   Blood pressure 119/67, pulse 74, temperature 98.1 F (36.7 C), resp. rate 17, height 5\' 2"  (1.575 m), weight 119.7 kg, last menstrual period 10/01/2021, SpO2 99 %.  No data found.    Physical Exam Constitutional:      General: She is not in acute distress.    Appearance: Normal appearance. She is not ill-appearing, toxic-appearing or diaphoretic.  HENT:     Head: Normocephalic.  Pulmonary:     Effort: Pulmonary effort is normal.  Skin:    General: Skin is warm.  Neurological:     Mental Status: She is alert and oriented to person, place, and time.     Deep Tendon Reflexes: Reflexes normal.  Psychiatric:        Behavior: Behavior normal.  Fetal Tracing  Baseline: 130 bpm Variability: Moderate  Accelerations: 15x15 Decelerations: None Toco: None MAU Course  Procedures  MDM  CBC, CMP, PCR: All reassuring  Tylenol 1 gram PO given. HA down to 0/10  Assessment and Plan   A:  1. Elevated BP without diagnosis of hypertension   2. [redacted] weeks gestation of pregnancy   3. Pregnancy headache in third trimester      P  Dc home Strict return precautions Message sent to [facility] for BP check on Monday Ok to check BP at home.   Venia Carbon I, NP 06/21/2022 10:21 AM

## 2022-06-19 NOTE — Discharge Instructions (Signed)
Look out for BPs 160/110. Recheck it if it is this high. If still elevated, come to MAU.  Ok to use tylenol for headaches.

## 2022-06-19 NOTE — MAU Note (Signed)
.  Jodi Jenkins is a 26 y.o. at [redacted]w[redacted]d here in MAU reporting headache tonight for 3 hrs. Has hx headaches.  Hands and feet swollen Took B/P and was 147/107. Denies LOF or VB. Reports good FM. No meds for headache tonight. Denies visual changes or epigastric pain. Onset of complaint: 3hrs Pain score: 7 Vitals:   06/19/22 0036 06/19/22 0038  BP:  (!) 126/94  Pulse: 73   Resp: 17   Temp: 98.1 F (36.7 C)   SpO2: 100%      FHT:164 Lab orders placed from triage:

## 2022-06-21 ENCOUNTER — Encounter (HOSPITAL_COMMUNITY): Payer: Self-pay | Admitting: Family Medicine

## 2022-06-21 ENCOUNTER — Inpatient Hospital Stay (HOSPITAL_COMMUNITY)
Admission: AD | Admit: 2022-06-21 | Discharge: 2022-06-22 | Disposition: A | Discharge status: Home / Self Care | Payer: BC Managed Care – PPO | Attending: Family Medicine | Admitting: Family Medicine

## 2022-06-21 ENCOUNTER — Other Ambulatory Visit: Payer: Self-pay

## 2022-06-21 DIAGNOSIS — R519 Headache, unspecified: Secondary | ICD-10-CM | POA: Diagnosis not present

## 2022-06-21 DIAGNOSIS — O26893 Other specified pregnancy related conditions, third trimester: Secondary | ICD-10-CM | POA: Diagnosis present

## 2022-06-21 DIAGNOSIS — O1203 Gestational edema, third trimester: Secondary | ICD-10-CM

## 2022-06-21 DIAGNOSIS — R609 Edema, unspecified: Secondary | ICD-10-CM | POA: Diagnosis not present

## 2022-06-21 DIAGNOSIS — O10913 Unspecified pre-existing hypertension complicating pregnancy, third trimester: Secondary | ICD-10-CM | POA: Insufficient documentation

## 2022-06-21 DIAGNOSIS — R030 Elevated blood-pressure reading, without diagnosis of hypertension: Secondary | ICD-10-CM

## 2022-06-21 DIAGNOSIS — Z3A35 35 weeks gestation of pregnancy: Secondary | ICD-10-CM | POA: Diagnosis not present

## 2022-06-21 HISTORY — DX: Gestational (pregnancy-induced) hypertension without significant proteinuria, unspecified trimester: O13.9

## 2022-06-21 LAB — URINALYSIS, ROUTINE W REFLEX MICROSCOPIC
Bilirubin Urine: NEGATIVE
Glucose, UA: NEGATIVE mg/dL
Hgb urine dipstick: NEGATIVE
Ketones, ur: NEGATIVE mg/dL
Leukocytes,Ua: NEGATIVE
Nitrite: NEGATIVE
Protein, ur: NEGATIVE mg/dL
Specific Gravity, Urine: 1.026 (ref 1.005–1.030)
pH: 6 (ref 5.0–8.0)

## 2022-06-21 LAB — COMPREHENSIVE METABOLIC PANEL
ALT: 11 U/L (ref 0–44)
AST: 12 U/L — ABNORMAL LOW (ref 15–41)
Albumin: 2.3 g/dL — ABNORMAL LOW (ref 3.5–5.0)
Alkaline Phosphatase: 132 U/L — ABNORMAL HIGH (ref 38–126)
Anion gap: 9 (ref 5–15)
BUN: 12 mg/dL (ref 6–20)
CO2: 22 mmol/L (ref 22–32)
Calcium: 8.9 mg/dL (ref 8.9–10.3)
Chloride: 105 mmol/L (ref 98–111)
Creatinine, Ser: 0.58 mg/dL (ref 0.44–1.00)
GFR, Estimated: >60 mL/min (ref >60)
Glucose, Bld: 94 mg/dL (ref 70–99)
Potassium: 4.1 mmol/L (ref 3.5–5.1)
Sodium: 136 mmol/L (ref 135–145)
Total Bilirubin: 0.3 mg/dL (ref 0.3–1.2)
Total Protein: 5.8 g/dL — ABNORMAL LOW (ref 6.5–8.1)

## 2022-06-21 LAB — PROTEIN / CREATININE RATIO, URINE
Creatinine, Urine: 133 mg/dL
Protein Creatinine Ratio: 0.11 mg/mg{Cre} (ref 0.00–0.15)
Total Protein, Urine: 15 mg/dL

## 2022-06-21 LAB — CBC
HCT: 35.7 % — ABNORMAL LOW (ref 36.0–46.0)
Hemoglobin: 12 g/dL (ref 12.0–15.0)
MCH: 29.2 pg (ref 26.0–34.0)
MCHC: 33.6 g/dL (ref 30.0–36.0)
MCV: 86.9 fL (ref 80.0–100.0)
Platelets: 232 10*3/uL (ref 150–400)
RBC: 4.11 MIL/uL (ref 3.87–5.11)
RDW: 13.4 % (ref 11.5–15.5)
WBC: 7.5 10*3/uL (ref 4.0–10.5)
nRBC: 0 % (ref 0.0–0.2)

## 2022-06-21 MED ORDER — ACETAMINOPHEN-CAFFEINE 500-65 MG PO TABS
2.0000 | ORAL_TABLET | Freq: Once | ORAL | Status: AC
  Administered 2022-06-21: 2 via ORAL
  Filled 2022-06-21: qty 2

## 2022-06-21 NOTE — MAU Provider Note (Signed)
History     CSN: [id number]  Arrival date and time: 06/21/22 2157   Event Date/Time   First Provider Initiated Contact with Patient 06/21/22 2244      Chief Complaint  Patient presents with   Headache   Hypertension   Decreased Fetal Movement   HPI [name] is a 26 y.o. year old G1P0 female at [redacted]w[redacted]d weeks gestation who presents to MAU reporting H/A for the past 4 hours (~1900 this evening), elevated BPs at home (ranging from 152/92 to 154/102), DFM since 1500 today (she's only felt 4-5 movements since 1500), and "constant" swelling in BLE since 05/22/2022. She has not taken any medications for the H/A. She denies any epigastric pain, UC's, VB, or LOF. She receives [facility] with [facility]; next appt is 06/22/2022. Her significant other is present and contributing to the history taking.   OB History     Gravida  1   Para      Term      Preterm      AB      Living         SAB      IAB      Ectopic      Multiple      Live Births                History reviewed. No pertinent surgical history.  Family History  Problem Relation Age of Onset   Hypertension Mother    Cancer Maternal Grandmother     Social History   Tobacco Use   Smoking status: Unknown  Vaping Use   Vaping Use: Never used  Substance Use Topics   Alcohol use: Never   Drug use: Never    Allergies: No Known Allergies  Medications Prior to Admission  Medication Sig Dispense Refill Last Dose   aspirin EC 81 MG tablet Take 81 mg by mouth daily. Swallow whole.   06/21/2022   prenatal vitamin w/FE, FA (PRENATAL 1 + 1) 27-1 MG TABS tablet Take 1 tablet by mouth daily at 12 noon.   06/21/2022    Review of Systems  Constitutional: Negative.   HENT: Negative.    Eyes: Negative.   Cardiovascular:  Positive for leg swelling.  Gastrointestinal: Negative.   Endocrine: Negative.   Genitourinary:        DFM since 1300  Musculoskeletal: Negative.   Skin: Negative.    Allergic/Immunologic: Negative.   Neurological:  Positive for headaches (frontal).  Hematological: Negative.   Psychiatric/Behavioral: Negative.     Physical Exam   Patient Vitals for the past 24 hrs:  BP Temp Temp src Pulse Resp SpO2 Height Weight  06/21/22 2347 131/78 -- -- 79 -- 100 % -- --  06/21/22 2330 129/84 -- -- 76 -- 100 % -- --  06/21/22 2325 -- -- -- -- -- 100 % -- --  06/21/22 2320 -- -- -- -- -- 99 % -- --  06/21/22 2315 123/73 -- -- 85 -- 99 % -- --  06/21/22 2310 -- -- -- -- -- 99 % -- --  06/21/22 2305 -- -- -- -- -- 99 % -- --  06/21/22 2300 134/80 -- -- 74 -- 99 % -- --  06/21/22 2255 -- -- -- -- -- 99 % -- --  06/21/22 2250 -- -- -- -- -- 99 % -- --  06/21/22 2245 137/81 -- -- 85 -- 99 % -- --  06/21/22 2240 -- -- -- -- -- 99 % -- --  06/21/22 2235 129/70 -- -- 88 -- 99 % -- --  06/21/22 2208 133/77 98.4 F (36.9 C) Oral 69 20 100 % 5\' 2"  (1.575 m) 120.2 kg    Physical Exam Vitals and nursing note reviewed.  Constitutional:      Appearance: Normal appearance. She is obese.  HENT:     Head: Normocephalic and atraumatic.  Cardiovascular:     Rate and Rhythm: Normal rate.     Pulses: Normal pulses.     Heart sounds: Normal heart sounds.  Pulmonary:     Effort: Pulmonary effort is normal.     Breath sounds: Normal breath sounds.  Abdominal:     General: Bowel sounds are normal.     Palpations: Abdomen is soft.  Genitourinary:    Comments: deferred Musculoskeletal:        General: Normal range of motion.  Skin:    General: Skin is warm and dry.  Neurological:     Mental Status: She is alert and oriented to person, place, and time.  Psychiatric:        Mood and Affect: Mood normal.        Behavior: Behavior normal.        Thought Content: Thought content normal.        Judgment: Judgment normal.    REACTIVE NST - FHR: 140 bpm / moderate variability / accels present / decels absent / TOCO: UI noted  MAU Course   Procedures  MDM CCUA CBC CMP P/C Ratio Serial BP's  Excedrin Tension Headache 2 tablets -- resolved H/A  Results for orders placed or performed during the hospital encounter of 06/21/22 (from the past 24 hour(s))  Urinalysis, Routine w reflex microscopic Urine, Clean Catch     Status: Abnormal   Collection Time: 06/21/22 10:19 PM  Result Value Ref Range   Color, Urine YELLOW YELLOW   APPearance HAZY (A) CLEAR   Specific Gravity, Urine 1.026 1.005 - 1.030   pH 6.0 5.0 - 8.0   Glucose, UA NEGATIVE NEGATIVE mg/dL   Hgb urine dipstick NEGATIVE NEGATIVE   Bilirubin Urine NEGATIVE NEGATIVE   Ketones, ur NEGATIVE NEGATIVE mg/dL   Protein, ur NEGATIVE NEGATIVE mg/dL   Nitrite NEGATIVE NEGATIVE   Leukocytes,Ua NEGATIVE NEGATIVE  Protein / creatinine ratio, urine     Status: None   Collection Time: 06/21/22 10:19 PM  Result Value Ref Range   Creatinine, Urine 133 mg/dL   Total Protein, Urine 15 mg/dL   Protein Creatinine Ratio 0.11 0.00 - 0.15 mg/mg[Cre]  CBC     Status: Abnormal   Collection Time: 06/21/22 11:04 PM  Result Value Ref Range   WBC 7.5 4.0 - 10.5 K/uL   RBC 4.11 3.87 - 5.11 MIL/uL   Hemoglobin 12.0 12.0 - 15.0 g/dL   HCT 06/23/22 (L) 81.8 - 29.9 %   MCV 86.9 80.0 - 100.0 fL   MCH 29.2 26.0 - 34.0 pg   MCHC 33.6 30.0 - 36.0 g/dL   RDW 37.1 69.6 - 78.9 %   Platelets 232 150 - 400 K/uL   nRBC 0.0 0.0 - 0.2 %  Comprehensive metabolic panel     Status: Abnormal   Collection Time: 06/21/22 11:04 PM  Result Value Ref Range   Sodium 136 135 - 145 mmol/L   Potassium 4.1 3.5 - 5.1 mmol/L   Chloride 105 98 - 111 mmol/L   CO2 22 22 - 32 mmol/L   Glucose, Bld 94 70 - 99 mg/dL  BUN 12 6 - 20 mg/dL   Creatinine, Ser 3.01 0.44 - 1.00 mg/dL   Calcium 8.9 8.9 - 60.1 mg/dL   Total Protein 5.8 (L) 6.5 - 8.1 g/dL   Albumin 2.3 (L) 3.5 - 5.0 g/dL   AST 12 (L) 15 - 41 U/L   ALT 11 0 - 44 U/L   Alkaline Phosphatase 132 (H) 38 - 126 U/L   Total Bilirubin 0.3 0.3 - 1.2 mg/dL    GFR, Estimated >09 >32 mL/min   Anion gap 9 5 - 15     Assessment and Plan  1. Elevated blood pressure reading in office without diagnosis of hypertension - Continue to monitor BP at home - Take home BP machine to next appt  2. Headache in pregnancy, antepartum, third trimester - Advised to purchase Excedrin Tension Headache and take as directed  3. Edema during pregnancy in third trimester - Reassured her swelling she is having is normal right now  4. [redacted] weeks gestation of pregnancy   - Discharge patient - Keep scheduled appt with [facility] on 06/22/2022 - Patient verbalized an understanding of the plan of care and agrees.  Raelyn Mora, CNM 06/21/2022, 10:44 PM

## 2022-06-21 NOTE — MAU Note (Signed)
..  Jodi Jenkins is a 26 y.o. at [redacted]w[redacted]d here in MAU reporting: headache since 4pm, has not taken anything for headache. Decreased fetal movement since 3pm (4-5 times since 3pm). Elevated home blood pressures of 150's/90's Denies epigastric pain. Denies ctx, Vaginal bleeding or leaking of fluid.   Pain score: 7/10 Vitals:   06/21/22 2208  BP: 133/77  Pulse: 69  Resp: 20  Temp: 98.4 F (36.9 C)  SpO2: 100%     FHT:150 Lab orders placed from triage: UA

## 2022-06-21 NOTE — Discharge Instructions (Addendum)
  Purchase this type of medication for your headaches, If headache not resolved after 2 hours, return to MAU for evaluation.  Please take your home blood pressure monitor with you to your next appointment, so someone can calibrate it and assess if you are using the correct cuff size. The lab results for pre-eclampsia were all within normal limits. Meaning you do not have pre-eclampsia or any high blood pressure condition at this time.

## 2022-06-22 ENCOUNTER — Encounter: Payer: Self-pay | Admitting: Obstetrics and Gynecology

## 2022-06-22 ENCOUNTER — Ambulatory Visit (INDEPENDENT_AMBULATORY_CARE_PROVIDER_SITE_OTHER): Payer: BC Managed Care – PPO

## 2022-06-22 ENCOUNTER — Other Ambulatory Visit (HOSPITAL_COMMUNITY)
Admission: RE | Admit: 2022-06-22 | Discharge: 2022-06-22 | Disposition: A | Discharge status: Home / Self Care | Payer: BC Managed Care – PPO | Source: Ambulatory Visit | Attending: Obstetrics and Gynecology | Admitting: Obstetrics and Gynecology

## 2022-06-22 ENCOUNTER — Ambulatory Visit (INDEPENDENT_AMBULATORY_CARE_PROVIDER_SITE_OTHER): Payer: BC Managed Care – PPO | Admitting: Obstetrics and Gynecology

## 2022-06-22 VITALS — BP 154/87

## 2022-06-22 DIAGNOSIS — O0990 Supervision of high risk pregnancy, unspecified, unspecified trimester: Secondary | ICD-10-CM | POA: Insufficient documentation

## 2022-06-22 DIAGNOSIS — Z3A35 35 weeks gestation of pregnancy: Secondary | ICD-10-CM

## 2022-06-22 DIAGNOSIS — O139 Gestational [pregnancy-induced] hypertension without significant proteinuria, unspecified trimester: Secondary | ICD-10-CM | POA: Insufficient documentation

## 2022-06-22 DIAGNOSIS — O133 Gestational [pregnancy-induced] hypertension without significant proteinuria, third trimester: Secondary | ICD-10-CM

## 2022-06-22 DIAGNOSIS — Z3A36 36 weeks gestation of pregnancy: Secondary | ICD-10-CM

## 2022-06-22 DIAGNOSIS — O0993 Supervision of high risk pregnancy, unspecified, third trimester: Secondary | ICD-10-CM

## 2022-06-22 LAB — OB RESULTS CONSOLE GBS: GBS: NEGATIVE

## 2022-06-22 NOTE — Patient Instructions (Signed)
Congratulations, you're on your way to having your baby!!!   You now have an induction scheduled.   If your induction is scheduled for midnight, you will arrive at 11:45pm on the date provided on the form form the clinical staff today. [facility] will [facility] (Entrance C) and tell them you are there for your induction. The hospital will only call you to not come if they have NO beds available and need to postpone your admission time.    If your induction is scheduled for the daytime, you will see an appointment time in MyChart. Please DO NOT show up at this time, this is just a placeholder on the schedule. You will get a call when your room is ready and will have 2 hours to arrive. The hospital staff can call anytime starting at 5 am through the rest of the day.   You will also get a call from the pre-admission nurse to go over pre-admission screen about 2 days prior to your induction date.      

## 2022-06-22 NOTE — Progress Notes (Signed)
Pt here for BP check. BP with office cuff is 154/87. BP on cuff from home is 139/97. Pt does have swelling in her feet and complains of a headache. Pt will be put on Dr.Duncan's schedule for evaluation.

## 2022-06-22 NOTE — Progress Notes (Signed)
BP on office cuff 154/87 BP on cuff from home 139/97  Pt c/o headache and swelling in her feet and hands

## 2022-06-22 NOTE — Progress Notes (Signed)
   PRENATAL VISIT NOTE  Subjective:  Jodi Jenkins is a 26 y.o. G1P0 at [redacted]w[redacted]d being seen today for ongoing prenatal care.  She is currently monitored for the following issues for this high-risk pregnancy and has Supervision of high risk pregnancy, antepartum; GAD (generalized anxiety disorder); BMI 38.0-38.9,adult; Obesity affecting pregnancy in second trimester; and Gestational hypertension on their problem list.  Patient reports headache.  Contractions: Not present. Vag. Bleeding: None.  Movement: Present. Denies leaking of fluid.   The following portions of the patient's history were reviewed and updated as appropriate: allergies, current medications, past family history, past medical history, past social history, past surgical history and problem list.   Objective:   Vitals:   06/22/22 1619  BP: (!) 154/87    Fetal Status:     Movement: Present     General:  Alert, oriented and cooperative. Patient is in no acute distress.  Skin: Skin is warm and dry. No rash noted.   Cardiovascular: Normal heart rate noted  Respiratory: Normal respiratory effort, no problems with respiration noted  Abdomen: Soft, gravid, appropriate for gestational age.  Pain/Pressure: Present     Pelvic: Cervical exam deferred        Extremities: Normal range of motion.  Edema: Mild pitting, slight indentation  Mental Status: Normal mood and affect. Normal behavior. Normal judgment and thought content.   Assessment and Plan:  Pregnancy: G1P0 at [redacted]w[redacted]d 1. Supervision of high risk pregnancy, antepartum - Cervicovaginal ancillary only( [place]) - Culture, beta strep (group b only)  2. Gestational hypertension, third trimester Discussed new diagnosis of gestational hypertension. Recommended delivery no later than 37w and scheduled for induction accordingly. Reviewed however that if her headache does not resolve with taking some excedrin tension (she had not yet tried anything for her headache), that I  recommend she return to MAU for evaluation. She had labs within the past 24 hours so we will not repeat those at this time. However, we discussed that if at any point she has a headache that doesn't go away with tylenol/excedrin tension or that she has blood pressures that are >160 SBP or >110 DBP that I recommend she go to the hospital as well for additional evaluation before her induction date. Pt and partner both voiced understanding and agree with the plan. MAU notified of potential of patient.   Preterm labor symptoms and general obstetric precautions including but not limited to vaginal bleeding, contractions, leaking of fluid and fetal movement were reviewed in detail with the patient. Please refer to After Visit Summary for other counseling recommendations.   Return in about 1 week (around 06/29/2022).  Future Appointments  Date Time Provider Department Center  07/01/2022 12:00 AM [facility] SCHED ROOM [facility] None  07/01/2022  2:30 PM [facility] NURSE [facility]  07/01/2022  2:45 PM [facility] US7 [facility]  07/30/2022  2:30 PM Constant, Gigi Gin, MD [facility]    Milas Hock, MD

## 2022-06-23 ENCOUNTER — Encounter: Payer: Self-pay | Admitting: Obstetrics and Gynecology

## 2022-06-24 ENCOUNTER — Encounter (HOSPITAL_COMMUNITY): Payer: Self-pay | Admitting: *Deleted

## 2022-06-24 ENCOUNTER — Inpatient Hospital Stay (HOSPITAL_COMMUNITY)
Admission: AD | Admit: 2022-06-24 | Discharge: 2022-06-24 | Disposition: A | Discharge status: Home / Self Care | Payer: BC Managed Care – PPO | Attending: Obstetrics and Gynecology | Admitting: Obstetrics and Gynecology

## 2022-06-24 ENCOUNTER — Other Ambulatory Visit: Payer: Self-pay

## 2022-06-24 ENCOUNTER — Inpatient Hospital Stay (HOSPITAL_COMMUNITY)
Admission: AD | Admit: 2022-06-24 | Payer: BC Managed Care – PPO | Source: Home / Self Care | Admitting: Obstetrics and Gynecology

## 2022-06-24 ENCOUNTER — Encounter (HOSPITAL_COMMUNITY): Payer: Self-pay | Admitting: Obstetrics and Gynecology

## 2022-06-24 ENCOUNTER — Telehealth (HOSPITAL_COMMUNITY): Payer: Self-pay | Admitting: *Deleted

## 2022-06-24 DIAGNOSIS — O133 Gestational [pregnancy-induced] hypertension without significant proteinuria, third trimester: Secondary | ICD-10-CM | POA: Insufficient documentation

## 2022-06-24 DIAGNOSIS — R519 Headache, unspecified: Secondary | ICD-10-CM | POA: Diagnosis not present

## 2022-06-24 DIAGNOSIS — O26892 Other specified pregnancy related conditions, second trimester: Secondary | ICD-10-CM

## 2022-06-24 DIAGNOSIS — O0990 Supervision of high risk pregnancy, unspecified, unspecified trimester: Secondary | ICD-10-CM

## 2022-06-24 DIAGNOSIS — O26893 Other specified pregnancy related conditions, third trimester: Secondary | ICD-10-CM | POA: Insufficient documentation

## 2022-06-24 DIAGNOSIS — Z3A36 36 weeks gestation of pregnancy: Secondary | ICD-10-CM | POA: Insufficient documentation

## 2022-06-24 DIAGNOSIS — O10913 Unspecified pre-existing hypertension complicating pregnancy, third trimester: Secondary | ICD-10-CM | POA: Diagnosis present

## 2022-06-24 LAB — PROTEIN / CREATININE RATIO, URINE
Creatinine, Urine: 135 mg/dL
Protein Creatinine Ratio: 0.12 mg/mg{Cre} (ref 0.00–0.15)
Total Protein, Urine: 16 mg/dL

## 2022-06-24 LAB — URINALYSIS, ROUTINE W REFLEX MICROSCOPIC
Bilirubin Urine: NEGATIVE
Glucose, UA: NEGATIVE mg/dL
Hgb urine dipstick: NEGATIVE
Ketones, ur: NEGATIVE mg/dL
Leukocytes,Ua: NEGATIVE
Nitrite: NEGATIVE
Protein, ur: NEGATIVE mg/dL
Specific Gravity, Urine: 1.018 (ref 1.005–1.030)
pH: 5 (ref 5.0–8.0)

## 2022-06-24 LAB — CBC
HCT: 38.9 % (ref 36.0–46.0)
Hemoglobin: 13 g/dL (ref 12.0–15.0)
MCH: 29.2 pg (ref 26.0–34.0)
MCHC: 33.4 g/dL (ref 30.0–36.0)
MCV: 87.4 fL (ref 80.0–100.0)
Platelets: 269 10*3/uL (ref 150–400)
RBC: 4.45 MIL/uL (ref 3.87–5.11)
RDW: 13.4 % (ref 11.5–15.5)
WBC: 7.6 10*3/uL (ref 4.0–10.5)
nRBC: 0 % (ref 0.0–0.2)

## 2022-06-24 LAB — COMPREHENSIVE METABOLIC PANEL
ALT: 12 U/L (ref 0–44)
AST: 19 U/L (ref 15–41)
Albumin: 2.6 g/dL — ABNORMAL LOW (ref 3.5–5.0)
Alkaline Phosphatase: 153 U/L — ABNORMAL HIGH (ref 38–126)
Anion gap: 10 (ref 5–15)
BUN: 7 mg/dL (ref 6–20)
CO2: 19 mmol/L — ABNORMAL LOW (ref 22–32)
Calcium: 8.6 mg/dL — ABNORMAL LOW (ref 8.9–10.3)
Chloride: 106 mmol/L (ref 98–111)
Creatinine, Ser: 0.64 mg/dL (ref 0.44–1.00)
GFR, Estimated: >60 mL/min (ref >60)
Glucose, Bld: 116 mg/dL — ABNORMAL HIGH (ref 70–99)
Potassium: 3.6 mmol/L (ref 3.5–5.1)
Sodium: 135 mmol/L (ref 135–145)
Total Bilirubin: 0.4 mg/dL (ref 0.3–1.2)
Total Protein: 6.6 g/dL (ref 6.5–8.1)

## 2022-06-24 LAB — CERVICOVAGINAL ANCILLARY ONLY
Chlamydia: NEGATIVE
Comment: NEGATIVE
Comment: NORMAL
Neisseria Gonorrhea: NEGATIVE

## 2022-06-24 NOTE — MAU Provider Note (Signed)
History     CSN: [id number]  Arrival date and time: 06/24/22 0865   Event Date/Time   First Provider Initiated Contact with Patient 06/24/22 1134      Chief Complaint  Patient presents with   Headache   Hypertension   [name] is a 26 y.o. year old G1P0 female at [redacted]w[redacted]d weeks gestation who presents to MAU reporting BP this AM was 144/109. Had a headache at 0300, took Excedrin Tension as recommended when she was in MAU on Sunday night. The Excedrin helped. Mild headache now. She reports she took her BP cuff to her appt this week and was told her cuff was compatible to the BP values in the office.No visual changes or RUQ pain. No labor s/s. +FM. She receives [facility] with Kathryne Sharper; next appt is and IOL at 37 wks. The FOB is present and contributing to the history taking.    OB History     Gravida  1   Para      Term      Preterm      AB      Living         SAB      IAB      Ectopic      Multiple      Live Births              Past Medical History:  Diagnosis Date   Pregnancy induced hypertension    Supervision of normal pregnancy 04/16/2022    Nursing Staff Provider Office Location   Dating  07/22/2022, by Other Basis PNC Model [ ]  Traditional [ ]  Centering [ ]  Mom-Baby Dyad Anatomy   Language     Flu Vaccine   Genetic/Carrier Screen  NIPS:  Neg  AFP: Neg   Horizon: TDaP Vaccine    Hgb A1C or  GTT Early  Third trimester  COVID Vaccine    LAB RESULTS  Rhogam     Blood Type   Opos Baby Feeding Plan  Antibody  Ne    Past Surgical History:  Procedure Laterality Date   NO PAST SURGERIES      Family History  Problem Relation Age of Onset   Hypertension Mother    Cancer Maternal Grandmother     Social History   Tobacco Use   Smoking status: Unknown  Vaping Use   Vaping Use: Never used  Substance Use Topics   Alcohol use: Never   Drug use: Never    Allergies: No Known Allergies  Medications Prior to Admission  Medication Sig  Dispense Refill Last Dose   aspirin EC 81 MG tablet Take 81 mg by mouth daily. Swallow whole.   06/23/2022   prenatal vitamin w/FE, FA (PRENATAL 1 + 1) 27-1 MG TABS tablet Take 1 tablet by mouth daily at 12 noon.   06/23/2022    Review of Systems  Constitutional: Negative.   HENT: Negative.    Eyes: Negative.   Respiratory: Negative.    Cardiovascular:        Elevated BPs at home  Gastrointestinal: Negative.   Endocrine: Negative.   Genitourinary: Negative.   Musculoskeletal: Negative.   Allergic/Immunologic: Negative.   Neurological:  Positive for headaches (mild).  Hematological: Negative.   Psychiatric/Behavioral: Negative.     Physical Exam   Patient Vitals for the past 24 hrs:  BP Temp Temp src Pulse Resp SpO2 Height Weight  06/24/22 1219 127/79 -- -- 64 -- -- -- --  06/24/22 1205 -- -- -- -- -- 99 % -- --  06/24/22 1201 134/80 -- -- 68 -- -- -- --  06/24/22 1200 -- -- -- -- -- 99 % -- --  06/24/22 1146 133/79 -- -- 68 -- -- -- --  06/24/22 1131 139/88 -- -- 76 -- -- -- --  06/24/22 1125 -- -- -- -- -- 99 % -- --  06/24/22 1120 -- -- -- -- -- 98 % -- --  06/24/22 1116 (!) 135/93 -- -- 71 -- -- -- --  06/24/22 1115 -- -- -- -- -- 99 % -- --  06/24/22 1101 138/82 -- -- 67 -- -- -- --  06/24/22 1046 135/78 -- -- 74 -- 99 % -- --  06/24/22 1045 -- -- -- -- -- 99 % -- --  06/24/22 1040 -- -- -- -- -- 99 % -- --  06/24/22 1035 -- -- -- -- -- 99 % -- --  06/24/22 1031 134/83 -- -- 74 -- -- -- --  06/24/22 1030 -- -- -- -- -- 99 % -- --  06/24/22 1016 133/78 -- -- 82 -- -- -- --  06/24/22 1010 132/81 -- -- 94 17 -- -- --  06/24/22 0948 (!) 143/89 98.1 F (36.7 C) Oral 87 16 99 % -- --  06/24/22 0944 -- -- -- -- -- -- 5\' 2"  (1.575 m) 118.8 kg     Physical Exam Vitals and nursing note reviewed.  Constitutional:      Appearance: Normal appearance. She is obese.  Cardiovascular:     Rate and Rhythm: Normal rate.  Pulmonary:     Effort: Pulmonary effort is normal.      Breath sounds: Normal breath sounds.  Abdominal:     General: Bowel sounds are normal.     Palpations: Abdomen is soft.  Genitourinary:    Comments: deferred Musculoskeletal:        General: Normal range of motion.  Skin:    General: Skin is warm and dry.  Neurological:     Mental Status: She is alert and oriented to person, place, and time.  Psychiatric:        Mood and Affect: Mood normal.        Behavior: Behavior normal.        Thought Content: Thought content normal.        Judgment: Judgment normal.    REACTIVE NST - FHR: 135 bpm / moderate variability / accels present / decels absent / TOCO: none MAU Course  Procedures  MDM CCUA CBC CMP P/C Ratio Serial BP's   Results for orders placed or performed during the hospital encounter of 06/24/22 (from the past 24 hour(s))  CBC     Status: None   Collection Time: 06/24/22 10:03 AM  Result Value Ref Range   WBC 7.6 4.0 - 10.5 K/uL   RBC 4.45 3.87 - 5.11 MIL/uL   Hemoglobin 13.0 12.0 - 15.0 g/dL   HCT 06/26/22 63.0 - 16.0 %   MCV 87.4 80.0 - 100.0 fL   MCH 29.2 26.0 - 34.0 pg   MCHC 33.4 30.0 - 36.0 g/dL   RDW 10.9 32.3 - 55.7 %   Platelets 269 150 - 400 K/uL   nRBC 0.0 0.0 - 0.2 %  Comprehensive metabolic panel     Status: Abnormal   Collection Time: 06/24/22 10:03 AM  Result Value Ref Range   Sodium 135 135 - 145 mmol/L   Potassium 3.6 3.5 - 5.1 mmol/L  Chloride 106 98 - 111 mmol/L   CO2 19 (L) 22 - 32 mmol/L   Glucose, Bld 116 (H) 70 - 99 mg/dL   BUN 7 6 - 20 mg/dL   Creatinine, Ser 3.81 0.44 - 1.00 mg/dL   Calcium 8.6 (L) 8.9 - 10.3 mg/dL   Total Protein 6.6 6.5 - 8.1 g/dL   Albumin 2.6 (L) 3.5 - 5.0 g/dL   AST 19 15 - 41 U/L   ALT 12 0 - 44 U/L   Alkaline Phosphatase 153 (H) 38 - 126 U/L   Total Bilirubin 0.4 0.3 - 1.2 mg/dL   GFR, Estimated >82 >99 mL/min   Anion gap 10 5 - 15  Protein / creatinine ratio, urine     Status: None   Collection Time: 06/24/22 10:43 AM  Result Value Ref Range    Creatinine, Urine 135 mg/dL   Total Protein, Urine 16 mg/dL   Protein Creatinine Ratio 0.12 0.00 - 0.15 mg/mg[Cre]  Urinalysis, Routine w reflex microscopic Urine, Clean Catch     Status: Abnormal   Collection Time: 06/24/22 10:43 AM  Result Value Ref Range   Color, Urine YELLOW YELLOW   APPearance HAZY (A) CLEAR   Specific Gravity, Urine 1.018 1.005 - 1.030   pH 5.0 5.0 - 8.0   Glucose, UA NEGATIVE NEGATIVE mg/dL   Hgb urine dipstick NEGATIVE NEGATIVE   Bilirubin Urine NEGATIVE NEGATIVE   Ketones, ur NEGATIVE NEGATIVE mg/dL   Protein, ur NEGATIVE NEGATIVE mg/dL   Nitrite NEGATIVE NEGATIVE   Leukocytes,Ua NEGATIVE NEGATIVE      Assessment and Plan  Gestational hypertension, third trimester - IOL next week - Advised to return to MAU for BPs 160/110 or greater  Headache in pregnancy, antepartum, second trimester - Continue treating with Excedrin Tension Headache tablets as directed on the bottle  [redacted] weeks gestation of pregnancy   - Discharge patient - Keep scheduled IOL appt next Wednesday - Patient verbalized an understanding of the plan of care and agrees.    Raelyn Mora, CNM 06/24/2022, 11:34 AM

## 2022-06-24 NOTE — MAU Note (Signed)
Jodi Jenkins is a 26 y.o. at [redacted]w[redacted]d here in MAU reporting: states BP this AM was 144/109. Had a headache at 0300, took excedrin and this helped. Mild headache now. No visual changes or RUQ pain. No labor s/s. +FM  Onset of complaint: today  Pain score: 3/10  Vitals:   06/24/22 0948  BP: (!) 143/89  Pulse: 87  Resp: 16  Temp: 98.1 F (36.7 C)  SpO2: 99%     FHT:133  Lab orders placed from triage: UA, bloodwork ordered by CNM

## 2022-06-24 NOTE — Telephone Encounter (Signed)
Preadmission screen  

## 2022-06-24 NOTE — Discharge Instructions (Signed)
Please return to MAU for blood pressure of greater than or equal to 160/110.

## 2022-06-26 ENCOUNTER — Other Ambulatory Visit: Payer: Self-pay | Admitting: Advanced Practice Midwife

## 2022-06-26 LAB — CULTURE, BETA STREP (GROUP B ONLY)
MICRO NUMBER:: 14218286
SPECIMEN QUALITY:: ADEQUATE

## 2022-06-30 ENCOUNTER — Other Ambulatory Visit: Payer: Self-pay | Admitting: Obstetrics and Gynecology

## 2022-06-30 ENCOUNTER — Other Ambulatory Visit: Payer: Self-pay | Admitting: Advanced Practice Midwife

## 2022-06-30 DIAGNOSIS — O133 Gestational [pregnancy-induced] hypertension without significant proteinuria, third trimester: Secondary | ICD-10-CM

## 2022-06-30 NOTE — Progress Notes (Incomplete)
OBSTETRIC ADMISSION HISTORY AND PHYSICAL  Daylynn Stumpp is a 26 y.o. female G1P0 with IUP at [redacted]w[redacted]d by [place] presenting for induction of labor 2/2 gestational hypertension. She reports +FMs, No LOF, no VB, no blurry vision, headaches or peripheral edema, and RUQ pain.  She plans on breast feeding. She request Slynd for birth control. She received her prenatal care at  [facility]    Dating: By [place] --->  Estimated Date of Delivery: 07/22/22  Sono:    @[redacted]w[redacted]d , CWD, normal anatomy, cephalic presentation, 2623g, 09-26-1984 EFW   Prenatal History/Complications:   Patient Active Problem List   Diagnosis Date Noted   Gestational hypertension 06/22/2022   Supervision of high risk pregnancy, antepartum 04/17/2022   Obesity affecting pregnancy in second trimester 04/17/2022   BMI 38.0-38.9,adult 01/06/2022   GAD (generalized anxiety disorder) 02/19/2020          Nursing Staff Provider  Office Location [facility] [place] Dating  07/22/2022, by Other Basis  PNC Model [x ] Traditional [ ]  Centering [ ]  Mom-Baby Dyad Anatomy 07/24/2022  Wnl, growth [ ]   Language  English      Flu Vaccine    Genetic/Carrier Screen  NIPS:NML    AFP: NML   Horizon:  TDaP Vaccine   Done 05/19/22 Hgb A1C or  GTT Early  Third trimester nml  COVID Vaccine     LAB RESULTS   Rhogam  --/--/O POS Performed at [facility], [address] [place], 05/21/22 [facility]  [phone] 1929)  Blood Type --/--/O POS Performed at [facility], [address], [place], 7/17 [facility]  [phone]/17 1929)   Baby Feeding Plan Breast Antibody  Neg  Contraception Slynd  Rubella  Immune  Circumcision Girl  RPR   NR  Pediatrician  [facility] HBsAg   Neg  Support Person Daniel HCVAb   Neg  Prenatal Classes   HIV     NR  BTL Consent NA GBS (For PCN allergy, check sensitivities)   VBAC Consent NA   Pap 12/21 Neg           DME Rx [ ]  BP cuff [ ]  Weight Scale Waterbirth  [ ]  Class [ ]  Consent [ ]   CNM visit  PHQ9 & GAD7 [  ] new OB [  ] 28 weeks  [  ] 36 weeks Induction  [ ]  Orders Entered [ ] Foley Y/N     Past Medical History: Past Medical History:  Diagnosis Date   Pregnancy induced hypertension    Supervision of normal pregnancy 04/16/2022    Nursing Staff Provider Office Location   Dating  07/22/2022, by Other Basis PNC Model [ ]  Traditional [ ]  Centering [ ]  Mom-Baby Dyad Anatomy 7/17   Language  {Misc; languages:60526}   Flu Vaccine   Genetic/Carrier Screen  NIPS:  Neg  AFP: Neg   Horizon: TDaP Vaccine    Hgb A1C or  GTT Early  Third trimester  COVID Vaccine    LAB RESULTS  Rhogam     Blood Type   Opos Baby Feeding Plan  Antibody  Ne    Past Surgical History: Past Surgical History:  Procedure Laterality Date   chin liposuction     NO PAST SURGERIES      Obstetrical History: OB History     Gravida  1   Para      Term      Preterm  AB      Living         SAB      IAB      Ectopic      Multiple      Live Births              Social History Social History   Socioeconomic History   Marital status: Married    Spouse name: Not on file   Number of children: Not on file   Years of education: Not on file   Highest education level: Not on file  Occupational History   Not on file  Tobacco Use   Smoking status: Unknown   Smokeless tobacco: Not on file  Vaping Use   Vaping Use: Never used  Substance and Sexual Activity   Alcohol use: Never   Drug use: Never   Sexual activity: Not Currently  Other Topics Concern   Not on file  Social History Narrative   Not on file   Social Determinants of Health   Financial Resource Strain: Not on file  Food Insecurity: Not on file  Transportation Needs: Not on file  Physical Activity: Not on file  Stress: Not on file  Social Connections: Not on file    Family History: Family History  Problem Relation Age of Onset   Hypertension Mother    Breast cancer Paternal Aunt    Cancer Maternal  Grandmother     Allergies: No Known Allergies  (Not in a hospital admission)    Review of Systems   All systems reviewed and negative except as stated in HPI  Last menstrual period 10/01/2021. General appearance: {general exam:16600} Lungs: easy work of breathing Abdomen: soft, non-tender; bowel sounds normal Extremities: Homans sign is negative, no sign of DVT  Fetal monitoringBaseline: *** bpm, Variability: {fhr variability:31519}, Accelerations: {fhr accel present:31520}, and Decelerations: {FHR DECEL PRESENT:31526} Uterine activityFrequency: {:31514}     Prenatal labs: ABO, Rh: --/--/O POS Performed at [facility], [address] [place], [place] [address]  [phone] 1929) Antibody:   Rubella:   RPR: NON-REACTIVE (09/29 0806)  HBsAg:    HIV: NON-REACTIVE (09/29 0806)  GBS:    1 hr Glucola *** Genetic screening  *** Anatomy US ***  Prenatal Transfer Tool  Maternal Diabetes: {Maternal Diabetes:3043596} Genetic Screening: {Genetic Screening:20205} Maternal Ultrasounds/Referrals: {Maternal Ultrasounds / Referrals:20211} Fetal Ultrasounds or other Referrals:  {Fetal Ultrasounds or Other Referrals:20213} Maternal Substance Abuse:  {Maternal Substance Abuse:20223} Significant Maternal Medications:  {Significant Maternal Meds:20233} Significant Maternal Lab Results:  {Significant Maternal Lab Results:20235} Number of Prenatal Visits:{Prenatal Visits:27860} Other Comments:  {Other Comments:20251}  No results found for this or any previous visit (from the past 24 hour(s)).  Patient Active Problem List   Diagnosis Date Noted   Gestational hypertension 06/22/2022   Supervision of high risk pregnancy, antepartum 04/17/2022   Obesity affecting pregnancy in second trimester 04/17/2022   BMI 38.0-38.9,adult 01/06/2022   GAD (generalized anxiety disorder) 02/19/2020    Assessment/Plan:  Anniya Whiters is a 26 y.o. G1P0 at [redacted]w[redacted]d here  for***  #Labor:*** #Pain: *** #FWB: *** #ID:  *** #MOF: *** #MOC:*** #Circ:  ***  Mckaylee Dimalanta N. Cherly Hensen, MD  06/30/2022, 11:21 PM

## 2022-07-01 ENCOUNTER — Other Ambulatory Visit: Payer: Self-pay

## 2022-07-01 ENCOUNTER — Encounter (HOSPITAL_COMMUNITY): Payer: Self-pay | Admitting: Obstetrics and Gynecology

## 2022-07-01 ENCOUNTER — Ambulatory Visit: Payer: BC Managed Care – PPO

## 2022-07-01 ENCOUNTER — Inpatient Hospital Stay (HOSPITAL_COMMUNITY): Payer: BC Managed Care – PPO | Attending: Obstetrics and Gynecology

## 2022-07-01 ENCOUNTER — Inpatient Hospital Stay (HOSPITAL_COMMUNITY)
Admission: AD | Admit: 2022-07-01 | Discharge: 2022-07-05 | DRG: 788 | Disposition: A | Discharge status: Home / Self Care | Payer: BC Managed Care – PPO | Attending: Obstetrics & Gynecology | Admitting: Obstetrics & Gynecology

## 2022-07-01 DIAGNOSIS — O134 Gestational [pregnancy-induced] hypertension without significant proteinuria, complicating childbirth: Principal | ICD-10-CM | POA: Diagnosis present

## 2022-07-01 DIAGNOSIS — Z7982 Long term (current) use of aspirin: Secondary | ICD-10-CM | POA: Diagnosis not present

## 2022-07-01 DIAGNOSIS — O9902 Anemia complicating childbirth: Secondary | ICD-10-CM | POA: Diagnosis present

## 2022-07-01 DIAGNOSIS — O99214 Obesity complicating childbirth: Secondary | ICD-10-CM | POA: Diagnosis present

## 2022-07-01 DIAGNOSIS — Z3A37 37 weeks gestation of pregnancy: Secondary | ICD-10-CM | POA: Diagnosis not present

## 2022-07-01 DIAGNOSIS — O139 Gestational [pregnancy-induced] hypertension without significant proteinuria, unspecified trimester: Secondary | ICD-10-CM | POA: Diagnosis present

## 2022-07-01 DIAGNOSIS — O133 Gestational [pregnancy-induced] hypertension without significant proteinuria, third trimester: Secondary | ICD-10-CM

## 2022-07-01 DIAGNOSIS — O0990 Supervision of high risk pregnancy, unspecified, unspecified trimester: Secondary | ICD-10-CM

## 2022-07-01 DIAGNOSIS — Z3490 Encounter for supervision of normal pregnancy, unspecified, unspecified trimester: Secondary | ICD-10-CM

## 2022-07-01 DIAGNOSIS — Z98891 History of uterine scar from previous surgery: Principal | ICD-10-CM

## 2022-07-01 LAB — CBC
HCT: 36.1 % (ref 36.0–46.0)
HCT: 39.2 % (ref 36.0–46.0)
Hemoglobin: 12.1 g/dL (ref 12.0–15.0)
Hemoglobin: 13.3 g/dL (ref 12.0–15.0)
MCH: 28.8 pg (ref 26.0–34.0)
MCH: 29.6 pg (ref 26.0–34.0)
MCHC: 33.5 g/dL (ref 30.0–36.0)
MCHC: 33.9 g/dL (ref 30.0–36.0)
MCV: 86 fL (ref 80.0–100.0)
MCV: 87.3 fL (ref 80.0–100.0)
Platelets: 216 10*3/uL (ref 150–400)
Platelets: 240 10*3/uL (ref 150–400)
RBC: 4.2 MIL/uL (ref 3.87–5.11)
RBC: 4.49 MIL/uL (ref 3.87–5.11)
RDW: 13.8 % (ref 11.5–15.5)
RDW: 14.1 % (ref 11.5–15.5)
WBC: 10.4 10*3/uL (ref 4.0–10.5)
WBC: 9.2 10*3/uL (ref 4.0–10.5)
nRBC: 0 % (ref 0.0–0.2)
nRBC: 0 % (ref 0.0–0.2)

## 2022-07-01 LAB — TYPE AND SCREEN
ABO/RH(D): O POS
Antibody Screen: NEGATIVE

## 2022-07-01 LAB — PROTEIN / CREATININE RATIO, URINE
Creatinine, Urine: 249 mg/dL
Protein Creatinine Ratio: 0.16 mg/mg{Cre} — ABNORMAL HIGH (ref 0.00–0.15)
Total Protein, Urine: 39 mg/dL

## 2022-07-01 LAB — COMPREHENSIVE METABOLIC PANEL
ALT: 11 U/L (ref 0–44)
AST: 21 U/L (ref 15–41)
Albumin: 2.7 g/dL — ABNORMAL LOW (ref 3.5–5.0)
Alkaline Phosphatase: 171 U/L — ABNORMAL HIGH (ref 38–126)
Anion gap: 14 (ref 5–15)
BUN: 12 mg/dL (ref 6–20)
CO2: 17 mmol/L — ABNORMAL LOW (ref 22–32)
Calcium: 8.9 mg/dL (ref 8.9–10.3)
Chloride: 104 mmol/L (ref 98–111)
Creatinine, Ser: 0.8 mg/dL (ref 0.44–1.00)
GFR, Estimated: >60 mL/min (ref >60)
Glucose, Bld: 127 mg/dL — ABNORMAL HIGH (ref 70–99)
Potassium: 3.5 mmol/L (ref 3.5–5.1)
Sodium: 135 mmol/L (ref 135–145)
Total Bilirubin: 0.1 mg/dL — ABNORMAL LOW (ref 0.3–1.2)
Total Protein: 6.3 g/dL — ABNORMAL LOW (ref 6.5–8.1)

## 2022-07-01 LAB — RPR: RPR Ser Ql: NONREACTIVE

## 2022-07-01 MED ORDER — EPHEDRINE 5 MG/ML INJ: 10.0000 mg | INTRAVENOUS | Status: DC | PRN

## 2022-07-01 MED ORDER — TERBUTALINE SULFATE 1 MG/ML IJ SOLN
0.2500 mg | Freq: Once | INTRAMUSCULAR | Status: DC | PRN
  Filled 2022-07-01: qty 1

## 2022-07-01 MED ORDER — FENTANYL CITRATE (PF) 100 MCG/2ML IJ SOLN
50.0000 ug | INTRAMUSCULAR | Status: DC | PRN
  Administered 2022-07-01 – 2022-07-02 (×3): 100 ug via INTRAVENOUS
  Filled 2022-07-01 (×3): qty 2

## 2022-07-01 MED ORDER — ONDANSETRON HCL 4 MG/2ML IJ SOLN: 4.0000 mg | Freq: Four times a day (QID) | INTRAMUSCULAR | Status: DC | PRN

## 2022-07-01 MED ORDER — OXYTOCIN-SODIUM CHLORIDE 30-0.9 UT/500ML-% IV SOLN
1.0000 m[IU]/min | INTRAVENOUS | Status: DC
  Administered 2022-07-01: 2 m[IU]/min via INTRAVENOUS
  Administered 2022-07-01: 4 m[IU]/min via INTRAVENOUS
  Administered 2022-07-01: 6 m[IU]/min via INTRAVENOUS

## 2022-07-01 MED ORDER — FENTANYL-BUPIVACAINE-NACL 0.5-0.125-0.9 MG/250ML-% EP SOLN
12.0000 mL/h | EPIDURAL | Status: DC | PRN
  Administered 2022-07-02: 11 mL/h via EPIDURAL
  Filled 2022-07-01: qty 250

## 2022-07-01 MED ORDER — LACTATED RINGERS IV SOLN
500.0000 mL | INTRAVENOUS | Status: DC | PRN
  Administered 2022-07-01: 500 mL via INTRAVENOUS

## 2022-07-01 MED ORDER — OXYTOCIN-SODIUM CHLORIDE 30-0.9 UT/500ML-% IV SOLN
2.5000 [IU]/h | INTRAVENOUS | Status: DC
  Filled 2022-07-01: qty 500

## 2022-07-01 MED ORDER — TERBUTALINE SULFATE 1 MG/ML IJ SOLN: 0.2500 mg | Freq: Once | INTRAMUSCULAR | Status: DC | PRN

## 2022-07-01 MED ORDER — OXYTOCIN BOLUS FROM INFUSION: 333.0000 mL | Freq: Once | INTRAVENOUS | Status: DC

## 2022-07-01 MED ORDER — MISOPROSTOL 50MCG HALF TABLET
50.0000 ug | ORAL_TABLET | ORAL | Status: DC | PRN
  Administered 2022-07-01 (×2): 50 ug via ORAL
  Filled 2022-07-01 (×2): qty 1

## 2022-07-01 MED ORDER — PHENYLEPHRINE 80 MCG/ML (10ML) SYRINGE FOR IV PUSH (FOR BLOOD PRESSURE SUPPORT): 80.0000 ug | PREFILLED_SYRINGE | INTRAVENOUS | Status: DC | PRN

## 2022-07-01 MED ORDER — MISOPROSTOL 50MCG HALF TABLET: 50.0000 ug | ORAL_TABLET | ORAL | Status: DC

## 2022-07-01 MED ORDER — MISOPROSTOL 25 MCG QUARTER TABLET
25.0000 ug | ORAL_TABLET | Freq: Once | ORAL | Status: AC
  Administered 2022-07-01: 25 ug via VAGINAL
  Filled 2022-07-01: qty 1

## 2022-07-01 MED ORDER — LACTATED RINGERS IV BOLUS
1000.0000 mL | Freq: Once | INTRAVENOUS | Status: AC
  Administered 2022-07-01: 1000 mL via INTRAVENOUS

## 2022-07-01 MED ORDER — ACETAMINOPHEN 500 MG PO TABS
1000.0000 mg | ORAL_TABLET | Freq: Once | ORAL | Status: AC
  Administered 2022-07-01: 1000 mg via ORAL
  Filled 2022-07-01: qty 2

## 2022-07-01 MED ORDER — MISOPROSTOL 50MCG HALF TABLET
50.0000 ug | ORAL_TABLET | Freq: Once | ORAL | Status: AC
  Administered 2022-07-01: 50 ug via ORAL
  Filled 2022-07-01: qty 1

## 2022-07-01 MED ORDER — LIDOCAINE HCL (PF) 1 % IJ SOLN: 30.0000 mL | INTRAMUSCULAR | Status: DC | PRN

## 2022-07-01 MED ORDER — ACETAMINOPHEN 325 MG PO TABS: 650.0000 mg | ORAL_TABLET | ORAL | Status: DC | PRN

## 2022-07-01 MED ORDER — DIPHENHYDRAMINE HCL 50 MG/ML IJ SOLN: 12.5000 mg | INTRAMUSCULAR | Status: DC | PRN

## 2022-07-01 MED ORDER — LACTATED RINGERS IV SOLN
500.0000 mL | Freq: Once | INTRAVENOUS | Status: AC
  Administered 2022-07-02: 500 mL via INTRAVENOUS

## 2022-07-01 MED ORDER — LACTATED RINGERS IV SOLN: INTRAVENOUS | Status: DC

## 2022-07-01 MED ORDER — SOD CITRATE-CITRIC ACID 500-334 MG/5ML PO SOLN
30.0000 mL | ORAL | Status: DC | PRN
  Administered 2022-07-02: 30 mL via ORAL
  Filled 2022-07-01: qty 30

## 2022-07-01 NOTE — Progress Notes (Signed)
LABOR PROGRESS NOTE  Jodi Jenkins is a 26 y.o. G1P0 at [redacted]w[redacted]d  admitted for IOL due to gHTN  Subjective: Resting comfortably feeling occasional contractions  Objective: BP (!) 141/96   Pulse 87   Temp 98 F (36.7 C) (Oral)   Resp 16   Ht 5\' 2"  (1.575 m)   Wt 120.2 kg   LMP 10/01/2021   SpO2 99%   BMI 48.47 kg/m  or  Vitals:   07/01/22 07/03/22 07/01/22 0719 07/01/22 0741 07/01/22 1106  BP: 114/60 (!) 149/92 (!) 145/84 (!) 141/96  Pulse: 90 81 85 87  Resp:  16  16  Temp:  98.1 F (36.7 C)  98 F (36.7 C)  TempSrc:  Oral  Oral  SpO2:  100%  99%  Weight:      Height:       Dilation: 4.5 Effacement (%): 70 Station: -2 Presentation: Vertex Exam by:: Dr. [email or web address] FHT: baseline rate 145, moderate varibility, + acel, no decel Toco: infrequent   Labs: Lab Results  Component Value Date   WBC 9.2 07/01/2022   HGB 13.3 07/01/2022   HCT 39.2 07/01/2022   MCV 87.3 07/01/2022   PLT 240 07/01/2022    Patient Active Problem List   Diagnosis Date Noted   Pregnancy 07/01/2022   Gestational hypertension 06/22/2022   Supervision of high risk pregnancy, antepartum 04/17/2022   Obesity affecting pregnancy in second trimester 04/17/2022   BMI 38.0-38.9,adult 01/06/2022   GAD (generalized anxiety disorder) 02/19/2020    Assessment / Plan: 26 y.o. G1P0 at [redacted]w[redacted]d here for IOL for gHTN  Labor: FB out. S/p cytotec. Initiating low dose pitocin Fetal Wellbeing:  Cat 1 Pain Control:  Per patient  Anticipated MOD:  Vaginal  gHTN: Mild range Bps, No symptoms. Neg Pre E lab   [redacted]w[redacted]d, MD  07/01/2022, 5:11 PM

## 2022-07-01 NOTE — Progress Notes (Addendum)
Labor Progress Note Rhianna Raulerson is a 26 y.o. G1P0 at [redacted]w[redacted]d presented for IOL due to gHTN S: Doing well. Beginning to have some pain. AROM performed about 2.5hrs ago, with moderate meconium. Now noted to have persistent fetal tachycardia with FHR in the 175 baseline. Normal maternal temp.   O:  BP (!) 143/72 (BP Location: Right Arm)   Pulse 94   Temp 98.4 F (36.9 C) (Oral)   Resp 17   Ht 5\' 2"  (1.575 m)   Wt 120.2 kg   LMP 10/01/2021   SpO2 100%   BMI 48.47 kg/m   Today's Vitals   07/01/22 2205 07/01/22 2208 07/01/22 2223 07/01/22 2231  BP: (!) 151/99 (!) 161/94 (!) 142/91 (!) 143/72  Pulse: 65 82 76 94  Resp: 16 16 16 17   Temp: 98.4 F (36.9 C)     TempSrc: Oral     SpO2:      Weight:      Height:      PainSc: 8       Body mass index is 48.47 kg/m.  EFM: 170bpm/moderate variability/ no accels, recurrent late decels  CVE: Dilation: 5 Effacement (%): 70 Station: -2 Presentation: Vertex Exam by:: , RN   A&P: 26 y.o. G1P0 [redacted]w[redacted]d IOL for gHTN. #Labor: Progressing well. S/p cytotec X 3 rounds; pitocin, AROM.  Pitocin turned off due to recurrent decels, and tachysystole. - PO tylenol, IVF bolus 1000cc, then re-evaluate. #Pain: desires epidural when in active labor #FWB: Category 2 - due to recurrent late decels, and tachycardia, however, overall reassuring  #GBS negative  gHTN - blood pressures elevated, but not in severe range.  Discussed with Dr 30.  Almetta Liddicoat [redacted]w[redacted]d, MD 11:27 PM

## 2022-07-01 NOTE — Progress Notes (Signed)
Patient ID: Jodi Jenkins, female   DOB: [birth year], 26 y.o.   MRN: [id number] Jodi Jenkins is a 26 y.o. G1P0 at [redacted]w[redacted]d admitted for induction of labor due to gestational hypertension   Subjective: feeling contractions some, but not uncomfortable and denies headache, visual changes, ruq/epigastric pain, n/v  Objective: BP (!) 149/92   Pulse 81   Temp 98.1 F (36.7 C) (Oral)   Resp 16   Ht 5\' 2"  (1.575 m)   Wt 120.2 kg   LMP 10/01/2021   SpO2 100%   BMI 48.47 kg/m  No intake/output data recorded.  FHR baseline 145 bpm, Variability: moderate, Accelerations:present, Decelerations:  Absent Toco: irregular   SVE:   ft/th/ballotable Cervical foley bulb inserted and inflated w/ 68ml LR w/o difficulty. Blood in tubing, no vaginal bleeding, FHR Cat 1, will continue to monitor  Labs: Lab Results  Component Value Date   WBC 9.2 07/01/2022   HGB 13.3 07/01/2022   HCT 39.2 07/01/2022   MCV 87.3 07/01/2022   PLT 240 07/01/2022    Assessment / Plan: IOL d/t GHTN, s/p cytotec x 2 (50/25, then 50 po @ 0550), foley bulb now in  Labor: cervical ripening phase Fetal Wellbeing:  Category I Pain Control:  n/a Pre-eclampsia: asymptomatic, bp's stable, and labs stable I/D:  GBS neg Anticipated MOD: NSVB  07/03/2022 CNM, WHNP-BC 07/01/2022, 7:54 AM

## 2022-07-01 NOTE — H&P (Addendum)
OBSTETRIC ADMISSION HISTORY AND PHYSICAL  Philamena Kramar is a 26 y.o. female G1P0 with IUP at [redacted]w[redacted]d by [place] presenting for induction of labor 2/2 gestational hypertension. She reports +FMs, No LOF, no VB, no blurry vision, headaches or peripheral edema, and RUQ pain.  She plans on breast feeding. She request Slynd for birth control. She received her prenatal care at  [facility]    Dating: By [place] --->  Estimated Date of Delivery: 07/22/22  Sono:    @[redacted]w[redacted]d , CWD, normal anatomy, cephalic presentation, 2623g, 09-26-1984 EFW   Prenatal History/Complications:   Patient Active Problem List   Diagnosis Date Noted   Pregnancy 07/01/2022   Gestational hypertension 06/22/2022   Supervision of high risk pregnancy, antepartum 04/17/2022   Obesity affecting pregnancy in second trimester 04/17/2022   BMI 38.0-38.9,adult 01/06/2022   GAD (generalized anxiety disorder) 02/19/2020          Nursing Staff Provider  Office Location [facility] [facility]ating  07/22/2022, by Other Basis  PNC Model [x ] Traditional [ ]  Centering [ ]  Mom-Baby Dyad Anatomy 07/24/2022  Wnl, growth [ ]   Language  English      Flu Vaccine    Genetic/Carrier Screen  NIPS:NML    AFP: NML   Horizon:  TDaP Vaccine   Done 05/19/22 Hgb A1C or  GTT Early  Third trimester nml  COVID Vaccine     LAB RESULTS   Rhogam  --/--/O POS Performed at [facility], [address] [place], 05/21/22 [facility]  [phone] 1929)  Blood Type --/--/O POS Performed at [facility], [address] [place], 7/17 [facility]  [phone]/17 1929)   Baby Feeding Plan Breast Antibody  Neg  Contraception Slynd  Rubella  Immune  Circumcision Girl  RPR   NR  Pediatrician  [facility] HBsAg   Neg  Support Person Daniel HCVAb   Neg  Prenatal Classes   HIV     NR  BTL Consent NA GBS (For PCN allergy, check sensitivities)   VBAC Consent NA   Pap 12/21 Neg           DME Rx [ ]  BP cuff [ ]  Weight Scale Waterbirth  [ ]   Class [ ]  Consent [ ]  CNM visit  PHQ9 & GAD7 [  ] new OB [  ] 28 weeks  [  ] 36 weeks Induction  [ ]  Orders Entered [ ] Foley Y/N     Past Medical History: Past Medical History:  Diagnosis Date   Pregnancy induced hypertension    Supervision of normal pregnancy 04/16/2022    Nursing Staff Provider Office Location   Dating  07/22/2022, by Other Basis PNC Model [ ]  Traditional [ ]  Centering [ ]  Mom-Baby Dyad Anatomy 7/17   Language     Flu Vaccine   Genetic/Carrier Screen  NIPS:  Neg  AFP: Neg   Horizon: TDaP Vaccine    Hgb A1C or  GTT Early  Third trimester  COVID Vaccine    LAB RESULTS  Rhogam     Blood Type   Opos Baby Feeding Plan  Antibody  Ne    Past Surgical History: Past Surgical History:  Procedure Laterality Date   chin liposuction     NO PAST SURGERIES      Obstetrical History: OB History     Gravida  1   Para      Term      Preterm  AB      Living         SAB      IAB      Ectopic      Multiple      Live Births              Social History Social History   Socioeconomic History   Marital status: Married    Spouse name: Not on file   Number of children: Not on file   Years of education: Not on file   Highest education level: Not on file  Occupational History   Not on file  Tobacco Use   Smoking status: Unknown   Smokeless tobacco: Not on file  Vaping Use   Vaping Use: Never used  Substance and Sexual Activity   Alcohol use: Never   Drug use: Never   Sexual activity: Not Currently  Other Topics Concern   Not on file  Social History Narrative   Not on file   Social Determinants of Health   Financial Resource Strain: Not on file  Food Insecurity: No Food Insecurity (07/01/2022)   Hunger Vital Sign    Worried About Running Out of Food in the Last Year: Never true    Ran Out of Food in the Last Year: Never true  Transportation Needs: Unmet Transportation Needs (07/01/2022)   PRAPARE - Administrator, Civil Service  (Medical): Yes    Lack of Transportation (Non-Medical): Yes  Physical Activity: Not on file  Stress: Not on file  Social Connections: Not on file    Family History: Family History  Problem Relation Age of Onset   Hypertension Mother    Breast cancer Paternal Aunt    Cancer Maternal Grandmother     Allergies: No Known Allergies  Medications Prior to Admission  Medication Sig Dispense Refill Last Dose   aspirin EC 81 MG tablet Take 81 mg by mouth daily. Swallow whole.   Past Week   prenatal vitamin w/FE, FA (PRENATAL 1 + 1) 27-1 MG TABS tablet Take 1 tablet by mouth daily at 12 noon.   07/01/2022      Review of Systems   All systems reviewed and negative except as stated in HPI  Last menstrual period 10/01/2021. General appearance: alert and cooperative Lungs: easy work of breathing Abdomen: soft, non-tender; bowel sounds normal Extremities: Homans sign is negative, no sign of DVT  Fetal monitoringBaseline: 145 bpm, Variability: Good {> 6 bpm), Accelerations: Reactive, and Decelerations: Absent Uterine activityFrequency: mild, irregular     Prenatal labs: ABO, Rh: --/--/O POS Performed at [facility], [address], [place], [place] [address]  [phone] 1929) Antibody:   Rubella:   RPR: NON-REACTIVE (09/29 0806)  HBsAg:    HIV: NON-REACTIVE (09/29 0806)  GBS:    1 hr Glucola wnl Genetic screening  wnl Anatomy US wnl  Prenatal Transfer Tool  Maternal Diabetes: No Genetic Screening: Normal Maternal Ultrasounds/Referrals: Normal Fetal Ultrasounds or other Referrals:  None Maternal Substance Abuse:  No Significant Maternal Medications:  None Significant Maternal Lab Results:  Group B Strep negative Number of Prenatal Visits:greater than 3 verified prenatal visits Other Comments:  None  No results found for this or any previous visit (from the past 24 hour(s)).  Patient Active Problem List   Diagnosis Date Noted   Pregnancy 07/01/2022   Gestational  hypertension 06/22/2022   Supervision of high risk pregnancy, antepartum 04/17/2022   Obesity affecting pregnancy in second trimester  04/17/2022   BMI 38.0-38.9,adult 01/06/2022   GAD (generalized anxiety disorder) 02/19/2020    Assessment/Plan:  Ivyanna Sibert is a 26 y.o. G1P0 at [redacted]w[redacted]d here for induction of labor 2/2 gestational hypertension, not on any blood pressure medications.  #Labor: IOL - cytotec and pitocin as needed #Pain: Epidural on request #FWB: Category 1 #ID:  GBS negative #MOF: breast #MOC: Slynd #Circ:  N/A  Jiyun N. Cherly Hensen, MD  07/01/2022, 1:10 AM  I spoke with and examined patient and agree with resident/PA-S/MS/SNM's note and plan of care. Denies ha, visual changes, ruq/epigastric pain, n/v.  Cx LTC, ballotable, vtx confirmed by informal TA bedside u/s.  Start w/ cytotec 50/25, foley bulb when able.  Cheral Marker, CNM, WHNP-BC 07/01/2022 1:28 AM

## 2022-07-02 ENCOUNTER — Other Ambulatory Visit: Payer: Self-pay

## 2022-07-02 ENCOUNTER — Inpatient Hospital Stay (HOSPITAL_COMMUNITY): Payer: BC Managed Care – PPO | Admitting: Anesthesiology

## 2022-07-02 ENCOUNTER — Encounter: Payer: BC Managed Care – PPO | Admitting: Obstetrics and Gynecology

## 2022-07-02 ENCOUNTER — Encounter (HOSPITAL_COMMUNITY): Payer: Self-pay | Admitting: Obstetrics and Gynecology

## 2022-07-02 ENCOUNTER — Encounter (HOSPITAL_COMMUNITY)
Admission: AD | Disposition: A | Discharge status: Home / Self Care | Payer: Self-pay | Source: Home / Self Care | Attending: Obstetrics & Gynecology

## 2022-07-02 DIAGNOSIS — Z98891 History of uterine scar from previous surgery: Principal | ICD-10-CM

## 2022-07-02 DIAGNOSIS — O134 Gestational [pregnancy-induced] hypertension without significant proteinuria, complicating childbirth: Secondary | ICD-10-CM

## 2022-07-02 DIAGNOSIS — O99214 Obesity complicating childbirth: Secondary | ICD-10-CM

## 2022-07-02 DIAGNOSIS — Z3A37 37 weeks gestation of pregnancy: Secondary | ICD-10-CM

## 2022-07-02 LAB — CBC
HCT: 35.8 % — ABNORMAL LOW (ref 36.0–46.0)
HCT: 34.6 % — ABNORMAL LOW (ref 36.0–46.0)
Hemoglobin: 12.2 g/dL (ref 12.0–15.0)
Hemoglobin: 11.4 g/dL — ABNORMAL LOW (ref 12.0–15.0)
MCH: 29.4 pg (ref 26.0–34.0)
MCH: 29.4 pg (ref 26.0–34.0)
MCHC: 34.1 g/dL (ref 30.0–36.0)
MCHC: 32.9 g/dL (ref 30.0–36.0)
MCV: 86.3 fL (ref 80.0–100.0)
MCV: 89.2 fL (ref 80.0–100.0)
Platelets: 199 10*3/uL (ref 150–400)
Platelets: 206 10*3/uL (ref 150–400)
RBC: 4.15 MIL/uL (ref 3.87–5.11)
RBC: 3.88 MIL/uL (ref 3.87–5.11)
RDW: 14 % (ref 11.5–15.5)
RDW: 14.3 % (ref 11.5–15.5)
WBC: 11.3 10*3/uL — ABNORMAL HIGH (ref 4.0–10.5)
WBC: 16.4 10*3/uL — ABNORMAL HIGH (ref 4.0–10.5)
nRBC: 0 % (ref 0.0–0.2)
nRBC: 0 % (ref 0.0–0.2)

## 2022-07-02 SURGERY — Surgical Case
Anesthesia: Epidural | Site: Abdomen | Wound class: Clean Contaminated

## 2022-07-02 MED ORDER — MORPHINE SULFATE (PF) 0.5 MG/ML IJ SOLN
INTRAMUSCULAR | Status: AC
  Filled 2022-07-02: qty 10

## 2022-07-02 MED ORDER — STERILE WATER FOR IRRIGATION IR SOLN
Status: DC | PRN
  Administered 2022-07-02: 1000 mL

## 2022-07-02 MED ORDER — KETOROLAC TROMETHAMINE 30 MG/ML IJ SOLN
30.0000 mg | Freq: Once | INTRAMUSCULAR | Status: AC | PRN
  Administered 2022-07-02: 30 mg via INTRAVENOUS

## 2022-07-02 MED ORDER — KETOROLAC TROMETHAMINE 30 MG/ML IJ SOLN
30.0000 mg | Freq: Four times a day (QID) | INTRAMUSCULAR | Status: AC | PRN
  Administered 2022-07-02: 30 mg via INTRAVENOUS

## 2022-07-02 MED ORDER — OXYTOCIN-SODIUM CHLORIDE 30-0.9 UT/500ML-% IV SOLN
2.5000 [IU]/h | INTRAVENOUS | Status: AC
  Administered 2022-07-02: 2.5 [IU]/h via INTRAVENOUS

## 2022-07-02 MED ORDER — ZOLPIDEM TARTRATE 5 MG PO TABS: 5.0000 mg | ORAL_TABLET | Freq: Every evening | ORAL | Status: DC | PRN

## 2022-07-02 MED ORDER — AMISULPRIDE (ANTIEMETIC) 5 MG/2ML IV SOLN: 10.0000 mg | Freq: Once | INTRAVENOUS | Status: DC | PRN

## 2022-07-02 MED ORDER — ACETAMINOPHEN 500 MG PO TABS
1000.0000 mg | ORAL_TABLET | Freq: Four times a day (QID) | ORAL | Status: DC
  Administered 2022-07-02 – 2022-07-05 (×12): 1000 mg via ORAL
  Filled 2022-07-02 (×13): qty 2

## 2022-07-02 MED ORDER — PHENYLEPHRINE 80 MCG/ML (10ML) SYRINGE FOR IV PUSH (FOR BLOOD PRESSURE SUPPORT)
PREFILLED_SYRINGE | INTRAVENOUS | Status: AC
  Filled 2022-07-02: qty 10

## 2022-07-02 MED ORDER — SODIUM CHLORIDE 0.9 % IV SOLN
INTRAVENOUS | Status: AC
  Filled 2022-07-02: qty 5

## 2022-07-02 MED ORDER — MEPERIDINE HCL 25 MG/ML IJ SOLN: 6.2500 mg | INTRAMUSCULAR | Status: DC | PRN

## 2022-07-02 MED ORDER — DIPHENHYDRAMINE HCL 25 MG PO CAPS: 25.0000 mg | ORAL_CAPSULE | ORAL | Status: DC | PRN

## 2022-07-02 MED ORDER — GABAPENTIN 300 MG PO CAPS
300.0000 mg | ORAL_CAPSULE | Freq: Two times a day (BID) | ORAL | Status: DC
  Administered 2022-07-02 – 2022-07-05 (×7): 300 mg via ORAL
  Filled 2022-07-02 (×7): qty 1

## 2022-07-02 MED ORDER — KETOROLAC TROMETHAMINE 30 MG/ML IJ SOLN
INTRAMUSCULAR | Status: AC
  Filled 2022-07-02: qty 1

## 2022-07-02 MED ORDER — FENTANYL CITRATE (PF) 100 MCG/2ML IJ SOLN
INTRAMUSCULAR | Status: DC | PRN
  Administered 2022-07-02: 100 ug via EPIDURAL

## 2022-07-02 MED ORDER — WITCH HAZEL-GLYCERIN EX PADS: 1.0000 | MEDICATED_PAD | CUTANEOUS | Status: DC | PRN

## 2022-07-02 MED ORDER — SODIUM CHLORIDE 0.9 % IR SOLN
Status: DC | PRN
  Administered 2022-07-02: 1000 mL

## 2022-07-02 MED ORDER — MENTHOL 3 MG MT LOZG: 1.0000 | LOZENGE | OROMUCOSAL | Status: DC | PRN

## 2022-07-02 MED ORDER — SCOPOLAMINE 1 MG/3DAYS TD PT72
1.0000 | MEDICATED_PATCH | Freq: Once | TRANSDERMAL | Status: AC
  Administered 2022-07-02: 1.5 mg via TRANSDERMAL
  Filled 2022-07-02: qty 1

## 2022-07-02 MED ORDER — NIFEDIPINE ER OSMOTIC RELEASE 30 MG PO TB24
30.0000 mg | ORAL_TABLET | Freq: Every day | ORAL | Status: DC
  Administered 2022-07-02 – 2022-07-05 (×4): 30 mg via ORAL
  Filled 2022-07-02 (×4): qty 1

## 2022-07-02 MED ORDER — FENTANYL CITRATE (PF) 100 MCG/2ML IJ SOLN
INTRAMUSCULAR | Status: AC
  Filled 2022-07-02: qty 2

## 2022-07-02 MED ORDER — ONDANSETRON HCL 4 MG/2ML IJ SOLN: 4.0000 mg | Freq: Once | INTRAMUSCULAR | Status: DC | PRN

## 2022-07-02 MED ORDER — LIDOCAINE HCL (PF) 1 % IJ SOLN
INTRAMUSCULAR | Status: DC | PRN
  Administered 2022-07-02 (×2): 4 mL via EPIDURAL

## 2022-07-02 MED ORDER — DEXAMETHASONE SODIUM PHOSPHATE 10 MG/ML IJ SOLN
INTRAMUSCULAR | Status: AC
  Filled 2022-07-02: qty 1

## 2022-07-02 MED ORDER — SIMETHICONE 80 MG PO CHEW: 80.0000 mg | CHEWABLE_TABLET | ORAL | Status: DC | PRN

## 2022-07-02 MED ORDER — KETOROLAC TROMETHAMINE 30 MG/ML IJ SOLN
30.0000 mg | Freq: Four times a day (QID) | INTRAMUSCULAR | Status: AC
  Administered 2022-07-03 (×2): 30 mg via INTRAVENOUS
  Filled 2022-07-02 (×3): qty 1

## 2022-07-02 MED ORDER — COCONUT OIL OIL: 1.0000 | TOPICAL_OIL | Status: DC | PRN

## 2022-07-02 MED ORDER — IBUPROFEN 600 MG PO TABS
600.0000 mg | ORAL_TABLET | Freq: Four times a day (QID) | ORAL | Status: DC
  Administered 2022-07-03 – 2022-07-05 (×8): 600 mg via ORAL
  Filled 2022-07-02 (×8): qty 1

## 2022-07-02 MED ORDER — CEFAZOLIN IN SODIUM CHLORIDE 3-0.9 GM/100ML-% IV SOLN
INTRAVENOUS | Status: AC
  Filled 2022-07-02: qty 100

## 2022-07-02 MED ORDER — SODIUM CHLORIDE 0.9% FLUSH: 3.0000 mL | INTRAVENOUS | Status: DC | PRN

## 2022-07-02 MED ORDER — LACTATED RINGERS AMNIOINFUSION: INTRAVENOUS | Status: DC

## 2022-07-02 MED ORDER — OXYTOCIN-SODIUM CHLORIDE 30-0.9 UT/500ML-% IV SOLN: 1.0000 m[IU]/min | INTRAVENOUS | Status: DC

## 2022-07-02 MED ORDER — PRENATAL MULTIVITAMIN CH
1.0000 | ORAL_TABLET | Freq: Every day | ORAL | Status: DC
  Administered 2022-07-03 – 2022-07-05 (×3): 1 via ORAL
  Filled 2022-07-02 (×3): qty 1

## 2022-07-02 MED ORDER — LACTATED RINGERS IV SOLN: INTRAVENOUS | Status: DC | PRN

## 2022-07-02 MED ORDER — TRANEXAMIC ACID-NACL 1000-0.7 MG/100ML-% IV SOLN
1000.0000 mg | INTRAVENOUS | Status: AC
  Administered 2022-07-02: 1000 mg via INTRAVENOUS

## 2022-07-02 MED ORDER — TETANUS-DIPHTH-ACELL PERTUSSIS 5-2.5-18.5 LF-MCG/0.5 IM SUSY: 0.5000 mL | PREFILLED_SYRINGE | Freq: Once | INTRAMUSCULAR | Status: DC

## 2022-07-02 MED ORDER — LIDOCAINE-EPINEPHRINE (PF) 2 %-1:200000 IJ SOLN
INTRAMUSCULAR | Status: DC | PRN
  Administered 2022-07-02: 10 mL via EPIDURAL
  Administered 2022-07-02: 5 mL via EPIDURAL

## 2022-07-02 MED ORDER — OXYCODONE HCL 5 MG PO TABS: 5.0000 mg | ORAL_TABLET | ORAL | Status: DC | PRN

## 2022-07-02 MED ORDER — PHENYLEPHRINE HCL-NACL 20-0.9 MG/250ML-% IV SOLN
INTRAVENOUS | Status: DC | PRN
  Administered 2022-07-02: 160 ug via INTRAVENOUS

## 2022-07-02 MED ORDER — TRANEXAMIC ACID-NACL 1000-0.7 MG/100ML-% IV SOLN
INTRAVENOUS | Status: AC
  Filled 2022-07-02: qty 100

## 2022-07-02 MED ORDER — OXYCODONE HCL 5 MG PO TABS: 5.0000 mg | ORAL_TABLET | Freq: Once | ORAL | Status: DC | PRN

## 2022-07-02 MED ORDER — DIBUCAINE (PERIANAL) 1 % EX OINT: 1.0000 | TOPICAL_OINTMENT | CUTANEOUS | Status: DC | PRN

## 2022-07-02 MED ORDER — ONDANSETRON HCL 4 MG/2ML IJ SOLN
INTRAMUSCULAR | Status: DC | PRN
  Administered 2022-07-02: 4 mg via INTRAVENOUS

## 2022-07-02 MED ORDER — LIDOCAINE-EPINEPHRINE (PF) 2 %-1:200000 IJ SOLN
INTRAMUSCULAR | Status: AC
  Filled 2022-07-02: qty 20

## 2022-07-02 MED ORDER — DEXAMETHASONE SODIUM PHOSPHATE 10 MG/ML IJ SOLN
INTRAMUSCULAR | Status: DC | PRN
  Administered 2022-07-02: 10 mg via INTRAVENOUS

## 2022-07-02 MED ORDER — HYDROMORPHONE HCL 1 MG/ML IJ SOLN: 1.0000 mg | INTRAMUSCULAR | Status: DC | PRN

## 2022-07-02 MED ORDER — ENOXAPARIN SODIUM 60 MG/0.6ML IJ SOSY
60.0000 mg | PREFILLED_SYRINGE | INTRAMUSCULAR | Status: DC
  Administered 2022-07-03 – 2022-07-05 (×3): 60 mg via SUBCUTANEOUS
  Filled 2022-07-02 (×3): qty 0.6

## 2022-07-02 MED ORDER — NALOXONE HCL 0.4 MG/ML IJ SOLN: 0.4000 mg | INTRAMUSCULAR | Status: DC | PRN

## 2022-07-02 MED ORDER — CEFAZOLIN IN SODIUM CHLORIDE 3-0.9 GM/100ML-% IV SOLN
3.0000 g | INTRAVENOUS | Status: AC
  Administered 2022-07-02: 3 g via INTRAVENOUS
  Filled 2022-07-02: qty 100

## 2022-07-02 MED ORDER — MEASLES, MUMPS & RUBELLA VAC IJ SOLR: 0.5000 mL | Freq: Once | INTRAMUSCULAR | Status: DC

## 2022-07-02 MED ORDER — FUROSEMIDE 20 MG PO TABS
20.0000 mg | ORAL_TABLET | Freq: Every day | ORAL | Status: DC
  Administered 2022-07-02 – 2022-07-03 (×2): 20 mg via ORAL
  Filled 2022-07-02 (×2): qty 1

## 2022-07-02 MED ORDER — DIPHENHYDRAMINE HCL 50 MG/ML IJ SOLN: 12.5000 mg | INTRAMUSCULAR | Status: DC | PRN

## 2022-07-02 MED ORDER — ONDANSETRON HCL 4 MG/2ML IJ SOLN
INTRAMUSCULAR | Status: AC
  Filled 2022-07-02: qty 2

## 2022-07-02 MED ORDER — SODIUM CHLORIDE 0.9 % IV SOLN
500.0000 mg | INTRAVENOUS | Status: AC
  Administered 2022-07-02: 500 mg via INTRAVENOUS

## 2022-07-02 MED ORDER — OXYCODONE HCL 5 MG/5ML PO SOLN: 5.0000 mg | Freq: Once | ORAL | Status: DC | PRN

## 2022-07-02 MED ORDER — KETOROLAC TROMETHAMINE 30 MG/ML IJ SOLN: 30.0000 mg | Freq: Four times a day (QID) | INTRAMUSCULAR | Status: AC | PRN

## 2022-07-02 MED ORDER — ONDANSETRON HCL 4 MG/2ML IJ SOLN: 4.0000 mg | Freq: Three times a day (TID) | INTRAMUSCULAR | Status: DC | PRN

## 2022-07-02 MED ORDER — SENNOSIDES-DOCUSATE SODIUM 8.6-50 MG PO TABS
2.0000 | ORAL_TABLET | Freq: Every day | ORAL | Status: DC
  Administered 2022-07-04 – 2022-07-05 (×2): 2 via ORAL
  Filled 2022-07-02 (×3): qty 2

## 2022-07-02 MED ORDER — OXYTOCIN-SODIUM CHLORIDE 30-0.9 UT/500ML-% IV SOLN
INTRAVENOUS | Status: AC
  Filled 2022-07-02: qty 500

## 2022-07-02 MED ORDER — NALOXONE HCL 4 MG/10ML IJ SOLN: 1.0000 ug/kg/h | INTRAVENOUS | Status: DC | PRN

## 2022-07-02 MED ORDER — MAGNESIUM HYDROXIDE 400 MG/5ML PO SUSP: 30.0000 mL | ORAL | Status: DC | PRN

## 2022-07-02 MED ORDER — DIPHENHYDRAMINE HCL 25 MG PO CAPS: 25.0000 mg | ORAL_CAPSULE | Freq: Four times a day (QID) | ORAL | Status: DC | PRN

## 2022-07-02 MED ORDER — OXYTOCIN-SODIUM CHLORIDE 30-0.9 UT/500ML-% IV SOLN
INTRAVENOUS | Status: DC | PRN
  Administered 2022-07-02: 200 [IU] via INTRAVENOUS

## 2022-07-02 MED ORDER — LACTATED RINGERS IV SOLN: INTRAVENOUS | Status: DC

## 2022-07-02 MED ORDER — ACETAMINOPHEN 500 MG PO TABS: 1000.0000 mg | ORAL_TABLET | Freq: Four times a day (QID) | ORAL | Status: DC

## 2022-07-02 MED ORDER — FERROUS SULFATE 325 (65 FE) MG PO TABS
325.0000 mg | ORAL_TABLET | ORAL | Status: DC
  Administered 2022-07-03 – 2022-07-05 (×2): 325 mg via ORAL
  Filled 2022-07-02 (×2): qty 1

## 2022-07-02 MED ORDER — OXYCODONE-ACETAMINOPHEN 5-325 MG PO TABS: 2.0000 | ORAL_TABLET | ORAL | Status: DC | PRN

## 2022-07-02 MED ORDER — HYDROMORPHONE HCL 1 MG/ML IJ SOLN: 0.2500 mg | INTRAMUSCULAR | Status: DC | PRN

## 2022-07-02 SURGICAL SUPPLY — 5 items
DRSG OPSITE POSTOP 4X10 (GAUZE/BANDAGES/DRESSINGS) IMPLANT
GAUZE SPONGE 4X4 12PLY STRL LF (GAUZE/BANDAGES/DRESSINGS) IMPLANT
PAD ABD 8X10 STRL (GAUZE/BANDAGES/DRESSINGS) IMPLANT
RETAINER VISCERAL (MISCELLANEOUS) IMPLANT
TAPE CLOTH SURG 4X10 WHT LF (GAUZE/BANDAGES/DRESSINGS) IMPLANT

## 2022-07-02 NOTE — Anesthesia Postprocedure Evaluation (Signed)
Anesthesia Post Note  Patient: Jodi Jenkins  Procedure(s) Performed: CESAREAN SECTION (Abdomen)     Patient location during evaluation: PACU Anesthesia Type: Epidural Level of consciousness: awake and alert and oriented Pain management: pain level controlled Vital Signs Assessment: post-procedure vital signs reviewed and stable Respiratory status: spontaneous breathing, nonlabored ventilation and respiratory function stable Cardiovascular status: blood pressure returned to baseline and stable Postop Assessment: no headache, no backache, epidural receding and no apparent nausea or vomiting Anesthetic complications: no   No notable events documented.  Last Vitals:  Vitals:   07/02/22 1030 07/02/22 1047  BP: (!) 122/46 113/74  Pulse: (!) 113 (!) 106  Resp: (!) 21 10  Temp:    SpO2: 100%     Last Pain:  Vitals:   07/02/22 1030  TempSrc:   PainSc: 0-No pain   Pain Goal:                Epidural/Spinal Function Cutaneous sensation: Tingles (07/02/22 1047), Patient able to flex knees: Yes (07/02/22 1047), Patient able to lift hips off bed: Yes (07/02/22 1047), Back pain beyond tenderness at insertion site: No (07/02/22 1047), Progressively worsening motor and/or sensory loss: No (07/02/22 1047), Bowel and/or bladder incontinence post epidural: No (07/02/22 1047)  Lannie Fields

## 2022-07-02 NOTE — Progress Notes (Signed)
    Faculty Practice OB/GYN Attending Note  FHR tracing has been Category 2 for over 30 minutes, not alleviated to neonatal intrauterine resuscitative maternal movements.   Pitocin turned off, terbutaline given, this helped alleviate some of the late decelerations.  As per report received, this is the second or third episode of this happening.   On cervical examination, she is still 5 cm dilated, unchanged since 2300 last night with fetal head molding concerning for arrest of dilation also.  Cesarean section recommended given nonreassuring fetal heart rate tracing, patient agreed to this plan.  The risks of surgery were discussed with the patient including but were not limited to: bleeding which may require transfusion or reoperation; infection which may require antibiotics; injury to bowel, bladder, ureters or other surrounding organs; injury to the fetus; need for additional procedures including hysterectomy in the event of a life-threatening hemorrhage; formation of adhesions; placental abnormalities with subsequent pregnancies; incisional problems; thromboembolic phenomenon and other postoperative/anesthesia complications.  The patient concurred with the proposed plan, giving informed written consent for the procedure.  Anesthesia and OR aware. Preoperative prophylactic antibiotics and SCDs ordered on call to the OR.  To OR when ready.   Jaynie Collins, MD, FACOG Obstetrician & Gynecologist, [facility] for [employer], [facility]

## 2022-07-02 NOTE — Anesthesia Procedure Notes (Signed)
Epidural Patient location during procedure: OB Start time: 07/02/2022 5:45 AM End time: 07/02/2022 5:54 AM  Staffing Anesthesiologist: Mal Amabile, MD Performed: anesthesiologist   Preanesthetic Checklist Completed: patient identified, IV checked, site marked, risks and benefits discussed, surgical consent, monitors and equipment checked, pre-op evaluation and timeout performed  Epidural Patient position: sitting Prep: DuraPrep and site prepped and draped Patient monitoring: continuous pulse ox and blood pressure Approach: midline Location: L4-L5 Injection technique: LOR air  Needle:  Needle type: Tuohy  Needle gauge: 17 G Needle length: 9 cm Needle insertion depth: 10 cm Catheter type: closed end flexible Catheter size: 19 Gauge Catheter at skin depth: 15 cm Test dose: negative and Other  Assessment Events: blood not aspirated, no cerebrospinal fluid, injection not painful, no injection resistance, no paresthesia and negative IV test  Additional Notes Patient identified. Risks and benefits discussed including failed block, incomplete  Pain control, post dural puncture headache, nerve damage, paralysis, blood pressure Changes, nausea, vomiting, reactions to medications-both toxic and allergic and post Partum back pain. All questions were answered. Patient expressed understanding and wished to proceed. Sterile technique was used throughout procedure. Epidural site was Dressed with sterile barrier dressing. No paresthesias, signs of intravascular injection Or signs of intrathecal spread were encountered. Difficult due to poor positioning and MO. Attempt x 2 Patient was more comfortable after the epidural was dosed. Please see RN's note for documentation of vital signs and FHR which are stable. Reason for block:procedure for pain

## 2022-07-02 NOTE — Transfer of Care (Signed)
Immediate Anesthesia Transfer of Care Note  Patient: Jodi Jenkins  Procedure(s) Performed: CESAREAN SECTION (Abdomen)  Patient Location: PACU  Anesthesia Type:Epidural  Level of Consciousness: awake  Airway & Oxygen Therapy: Patient Spontanous Breathing  Post-op Assessment: Report given to RN and Post -op Vital signs reviewed and stable  Post vital signs: Reviewed and stable  Last Vitals:  Vitals Value Taken Time  BP    Temp    Pulse    Resp    SpO2      Last Pain:  Vitals:   07/02/22 0714  TempSrc:   PainSc: Asleep         Complications: No notable events documented.

## 2022-07-02 NOTE — Lactation Note (Addendum)
This note was copied from a baby's chart. Lactation Consultation Note  Patient Name: Jodi Jenkins SWNIO'[id number] Date: 07/02/2022 Reason for consult: Initial assessment;1st time breastfeeding (C/S delivery) Age:24 hours Birth Parents current feeding choice is : breastfeeding and supplementing with formula for each feeding.  LC discussed LPTI/LBW infant feeding policy with Birth Parent. Per Birth Parent, she does have DEBP that she brought with her from home. Per Birth Parent, infant latched at 1545 pm for 20 minutes, LC did not observe latch. LC discussed to limit infant's total feedings at breast and bottle to 30 minutes or less, due infant being LBW and to help reserve infant's energy. Infant was pace feed 9 mls of 22 kcal formula. LC observe infant doesn't have a coordinate suck, formula sipping side of infant's mouth and gagging  with purple & clear nipple as well as White Nfant nipple, LC suggested RN that infant may need SLP consult. LC set Birth Parent up with DEBP, using 27 mm breast flange, Birth Parent was pumping while LC in room and expressed 1 ml  EBM that MGM finger feed to infant during the pace feeding.  Birth Parent current feeding plan: 1- Continue to BF infant 8x within 24 hours, limit chest feeding 15 minutes or less and afterwards every feeding  supplement infant with formula,  Day 1 based on infant's birth weight amount is 12-13 mls per feeding. 2- Continue to use DEBP and give infant back any EBM that is pump before formula. When using formula pace feed infant continue to try White Nfant or Lavender Nfant use nipple that infant seems to tolerate better. 3- Birth Parent knows to call RN/LC if their are further breastfeeding questions, concerns or if she need latch assistance.  Maternal Data Has patient been taught Hand Expression?: Yes  Feeding Mother's Current Feeding Choice: Breast Milk and Formula  LATCH Score                    Lactation Tools  Discussed/Used Tools: Pump;Flanges Flange Size: 27 Breast pump type: Double-Electric Breast Pump Pump Education: Setup, frequency, and cleaning;Milk Storage Reason for Pumping: ETI infant less than 6 lbs at birth Pumping frequency: Birth Parent will pump every 3 hours for 15 minutes on inital setting. Pumped volume: 1 mL  Interventions Interventions: Breast feeding basics reviewed;Skin to skin;DEBP;Education;Pace feeding;LC Services brochure;LPT handout/interventions  Discharge    Consult Status Consult Status: Follow-up Date: 07/03/22 Follow-up type: In-patient    Frederico Hamman 07/02/2022, 5:41 PM

## 2022-07-02 NOTE — Op Note (Addendum)
Waldon Reining PROCEDURE DATE: 07/02/2022  PREOPERATIVE DIAGNOSES: Intrauterine pregnancy at [redacted]w[redacted]d weeks gestation; non-reassuring fetal status; gestational hypertension  POSTOPERATIVE DIAGNOSES: The same  PROCEDURE: Low Transverse Cesarean Section  SURGEON:  Dr. Jaynie Collins  ASSISTANT:  Dr. Patrcia Dolly. An experienced assistant was required given the standard of surgical care given the complexity of the case.  This assistant was needed for exposure, dissection, suctioning, retraction, instrument exchange, assisting with delivery with administration of fundal pressure, and for overall help during the procedure.  ANESTHESIOLOGY TEAM: Anesthesiologist: Morton Peters, MD CRNA: Renford Dills, CRNA  INDICATIONS: Imagine Nest is a 26 y.o. G1P0 at [redacted]w[redacted]d here for cesarean section secondary to the indications listed under preoperative diagnoses; please see preoperative note for further details.  The risks of surgery were discussed with the patient including but were not limited to: bleeding which may require transfusion or reoperation; infection which may require antibiotics; injury to bowel, bladder, ureters or other surrounding organs; injury to the fetus; need for additional procedures including hysterectomy in the event of a life-threatening hemorrhage; formation of adhesions; placental abnormalities wth subsequent pregnancies; incisional problems; thromboembolic phenomenon and other postoperative/anesthesia complications.  The patient concurred with the proposed plan, giving informed written consent for the procedure.    FINDINGS:  Viable female infant in cephalic presentation.  Apgars 9/9, infant weight 2400 g. Moderate meconium in amniotic fluid.  Intact small placenta, three vessel cord that was noted to be very thin.  Normal uterus, fallopian tubes and ovaries bilaterally.  ANESTHESIA: Epidural  ESTIMATED BLOOD LOSS: 550 ml SPECIMENS:  Placenta sent to pathology COMPLICATIONS: None immediate  PROCEDURE IN DETAIL:  The patient preoperatively received intravenous antibiotics and had sequential compression devices applied to her lower extremities.  She was then taken to the operating room where the epidural anesthesia was dosed up to surgical level and was found to be adequate. She was then placed in a dorsal supine position with a leftward tilt, and prepped and draped in a sterile manner.  A foley catheter was placed into her bladder and attached to constant gravity.  After an adequate timeout was performed, a Pfannenstiel skin incision was made with scalpel and carried through to the underlying layer of fascia. The fascia was incised in the midline, and this incision was extended bilaterally in a blunt fashion.  The underlying rectus muscles were dissected off the fascia superiorly and inferiorly in a blunt fashion. The rectus muscles were separated in the midline and the peritoneum was entered bluntly. The Alexis self-retaining retractor was introduced into the abdominal cavity.  Attention was turned to the lower uterine segment where a low transverse hysterotomy was made with a scalpel and extended bilaterally bluntly.  The infant was successfully delivered, the cord was clamped and cut after one minute, and the infant was handed over to the awaiting neonatology team. Uterine massage was then administered, and the placenta delivered intact with a three-vessel cord. The uterus was then cleared of clots and debris.  The hysterotomy was closed with 0 Vicryl in a running locked fashion, and figure-of-eight 0 Vicryl serosal stitches were placed to help with hemostasis.  The pelvis was cleared of all clot and debris. Hemostasis was confirmed on all surfaces.  The retractor was removed.  The peritoneum was closed with a 0 Vicryl running stitch. The fascia was then closed using 0 PDS in a running fashion.  The subcutaneous layer was irrigated,  reapproximated with 2-0 plain gut interrupted stitches, and the  skin was closed with a 4-0 Vicryl subcuticular stitch. The patient tolerated the procedure well. Sponge, instrument and needle counts were correct x 3.  She was taken to the recovery room in stable condition.    Jaynie Collins, MD, FACOG Obstetrician & Gynecologist, [facility] for [employer], [facility]

## 2022-07-02 NOTE — Progress Notes (Signed)
Labor Progress Note Jodi Jenkins is a 26 y.o. G1P0 at [redacted]w[redacted]d presented for IOL due to gHTN S: re-evaluated patient. She has no concerns. Fetal tachycardia has improved following tylenol, still having occasional late decels, but overall good variability.   O:  BP (!) 142/72   Pulse 85   Temp 97.9 F (36.6 C) (Oral)   Resp 15   Ht 5\' 2"  (1.575 m)   Wt 120.2 kg   LMP 10/01/2021   SpO2 99%   BMI 48.47 kg/m   Today's Vitals   07/02/22 0649 07/02/22 0650 07/02/22 0700 07/02/22 0714  BP:   (!) 142/72   Pulse:   85   Resp:      Temp:  97.9 F (36.6 C)    TempSrc:  Oral    SpO2:      Weight:      Height:      PainSc: 9    Asleep   Body mass index is 48.47 kg/m.  EFM: 150bpm, moderate variability, +accels, intermittent variable decels  CVE: Dilation: 5 Effacement (%): 70, 80 Station: -2 Presentation: Vertex Exam by:: [email or web address] [id number], RN   A&P: 26 y.o. G1P0 [redacted]w[redacted]d IOL for gHTN. #Labor: Progressing well. S/p cytotec X 3 rounds; pitocin, AROM.  FHR doing better, no cervical change, will restart pitocin at 1X1 and monitor closely. #Pain: desires epidural when in active labor #FWB: Category 2 - due to intermittent late decels, but overall reassuring. #GBS negative  gHTN - blood pressures elevated, but not in severe range.   Jodi Jenkins [redacted]w[redacted]d, MD 8:22 AM

## 2022-07-02 NOTE — Anesthesia Preprocedure Evaluation (Signed)
Anesthesia Evaluation  Patient identified by MRN, date of birth, ID band Patient awake    Reviewed: Allergy & Precautions, Patient's Chart, lab work & pertinent test results  Airway Mallampati: II       Dental no notable dental hx.    Pulmonary neg pulmonary ROS   Pulmonary exam normal        Cardiovascular hypertension, Normal cardiovascular exam     Neuro/Psych  PSYCHIATRIC DISORDERS Anxiety     negative neurological ROS     GI/Hepatic Neg liver ROS,GERD  ,,  Endo/Other    Morbid obesity  Renal/GU negative Renal ROS  negative genitourinary   Musculoskeletal negative musculoskeletal ROS (+)    Abdominal  (+) + obese  Peds  Hematology negative hematology ROS (+)   Anesthesia Other Findings   Reproductive/Obstetrics (+) Pregnancy                              Anesthesia Physical Anesthesia Plan  ASA: 3  Anesthesia Plan: Epidural   Post-op Pain Management: Minimal or no pain anticipated and Epidural*   Induction:   PONV Risk Score and Plan:   Airway Management Planned: Natural Airway  Additional Equipment:   Intra-op Plan:   Post-operative Plan:   Informed Consent: I have reviewed the patients History and Physical, chart, labs and discussed the procedure including the risks, benefits and alternatives for the proposed anesthesia with the patient or authorized representative who has indicated his/her understanding and acceptance.       Plan Discussed with: Anesthesiologist  Anesthesia Plan Comments:          Anesthesia Quick Evaluation

## 2022-07-02 NOTE — Progress Notes (Signed)
MOB was referred for history of depression/anxiety. * Referral screened out by Clinical Social Worker because none of the following criteria appear to apply: ~ History of anxiety/depression during this pregnancy, or of post-partum depression following prior delivery. ~ Diagnosis of anxiety and/or depression within last 3 years. No concerns note in OB records. OR * MOB's symptoms currently being treated with medication and/or therapy.  Please contact the Clinical Social Worker if needs arise, by [facility] request, or if MOB scores greater than 9/yes to question 10 on Edinburgh Postpartum Depression Screen.   Blaine Hamper, MSW, LCSW Clinical Social Work [phone]

## 2022-07-02 NOTE — Discharge Summary (Addendum)
Postpartum Discharge Summary    Patient Name: Jodi Jenkins DOB: Dec 02, 1995 MRN: [id number]  Date of admission: 07/01/2022 Delivery date:07/02/2022  Delivering provider: Shelda Pal  Date of discharge: 07/05/2022  Admitting diagnosis: Pregnancy [Z34.90] Intrauterine pregnancy: [redacted]w[redacted]d    Secondary diagnosis:  Principal Problem:   S/P cesarean section Active Problems:   Supervision of high risk pregnancy, antepartum   Morbid obesity (HDonalds   Gestational hypertension  Additional problems: none    Discharge diagnosis: Term Pregnancy Delivered and Gestational Hypertension                                              Post partum procedures: none Augmentation: Pitocin, Cytotec, and IP Foley Complications: None  Hospital course: Induction of Labor With Cesarean Section   26y.o. yo G1P1001 at 354w1das admitted to the hospital 07/01/2022 for induction of labor. Patient had a labor course significant for late decels despite interventions. The patient went for cesarean section due to Non-Reassuring FHR. Delivery details are as follows: Membrane Rupture Time/Date: 8:55 PM ,07/01/2022   Delivery Method:C-Section, Low Transverse  Details of operation can be found in separate operative Note.  Patient had a postpartum course that was uncomplicated. She is ambulating, tolerating a regular diet, passing flatus, and urinating well.  Patient is discharged home in stable condition on 07/05/22.      Newborn Data: Birth date:07/02/2022  Birth time:8:54 AM  Gender:Female  Living status:Living  Apgars:9 ,9  Weight:2400 g                                Magnesium Sulfate received: No BMZ received: No Rhophylac:N/A MMR:N/A T-DaP:Given prenatally Flu: No Transfusion:No  Physical exam  Vitals:   07/04/22 0524 07/04/22 1512 07/04/22 2254 07/05/22 1028  BP: 133/73 138/75 133/74 (!) 140/93  Pulse: 91 (!) 107 89 87  Resp: _0 Temp: 98 F (36.7 C) 98.3 F (36.8 C) 98.6  F (37 C) 97.7 F (36.5 C)  TempSrc: Oral Oral Oral Oral  SpO2: 99% 98% 97% 97%  Weight:      Height:       General: alert, cooperative, and no distress Lochia: appropriate Uterine Fundus: firm Incision: Healing well with no significant drainage DVT Evaluation: No evidence of DVT seen on physical exam. Labs: Lab Results  Component Value Date   WBC 13.1 (H) 07/03/2022   HGB 9.4 (L) 07/03/2022   HCT 27.9 (L) 07/03/2022   MCV 86.6 07/03/2022   PLT 170 07/03/2022      Latest Ref Rng & Units 07/03/2022    6:12 AM  CMP  Glucose 70 - 99 mg/dL 82   BUN 6 - 20 mg/dL 8   Creatinine 0.44 - 1.00 mg/dL 0.61   Sodium 135 - 145 mmol/L 137   Potassium 3.5 - 5.1 mmol/L 3.4   Chloride 98 - 111 mmol/L 109   CO2 22 - 32 mmol/L 22   Calcium 8.9 - 10.3 mg/dL 8.0   Total Protein 6.5 - 8.1 g/dL 4.8   Total Bilirubin 0.3 - 1.2 mg/dL <0.1   Alkaline Phos 38 - 126 U/L 101   AST 15 - 41 U/L 16   ALT 0 - 44 U/L 9    Edinburgh Score:    07/04/2022  1:34 AM  Edinburgh Postnatal Depression Scale Screening Tool  I have been able to laugh and see the funny side of things. 0  I have looked forward with enjoyment to things. 0  I have blamed myself unnecessarily when things went wrong. 2  I have been anxious or worried for no good reason. 2  I have felt scared or panicky for no good reason. 1  Things have been getting on top of me. 1  I have been so unhappy that I have had difficulty sleeping. 0  I have felt sad or miserable. 1  I have been so unhappy that I have been crying. 1  The thought of harming myself has occurred to me. 0  Edinburgh Postnatal Depression Scale Total 8     After visit meds:  Allergies as of 07/05/2022   No Known Allergies      Medication List     STOP taking these medications    aspirin EC 81 MG tablet       TAKE these medications    acetaminophen 500 MG tablet Commonly known as: TYLENOL Take 2 tablets (1,000 mg total) by mouth every 6 (six) hours as  needed.   ferrous sulfate 325 (65 FE) MG tablet Commonly known as: FerrouSul Take 1 tablet (325 mg total) by mouth every other day.   furosemide 20 MG tablet Commonly known as: LASIX Take 1 tablet (20 mg total) by mouth 2 (two) times daily.   gabapentin 300 MG capsule Commonly known as: Neurontin Take 1 capsule (300 mg total) by mouth 2 (two) times daily as needed.   ibuprofen 600 MG tablet Commonly known as: ADVIL Take 1 tablet (600 mg total) by mouth every 6 (six) hours as needed.   NIFEdipine 30 MG 24 hr tablet Commonly known as: ADALAT CC Take 1 tablet (30 mg total) by mouth daily.   oxyCODONE 5 MG immediate release tablet Commonly known as: Oxy IR/ROXICODONE Take 1-2 tablets (5-10 mg total) by mouth every 4 (four) hours as needed for moderate pain.   polyethylene glycol powder 17 GM/SCOOP powder Commonly known as: GLYCOLAX/MIRALAX Take 17 g by mouth daily as needed for moderate constipation or mild constipation.   prenatal vitamin w/FE, FA 27-1 MG Tabs tablet Take 1 tablet by mouth daily at 12 noon.   Slynd 4 MG Tabs Generic drug: Drospirenone Take 1 tablet by mouth daily.         Discharge home in stable condition Infant Feeding: Breast Infant Disposition:home with mother Discharge instruction: per After Visit Summary and Postpartum booklet. Activity: Advance as tolerated. Pelvic rest for 6 weeks.  Diet: routine diet Future Appointments: Future Appointments  Date Time Provider [place]  07/10/2022 10:50 AM Julianne Handler, CNM [facility]  07/30/2022  2:30 PM Inez Catalina, MD [facility]   Follow up Visit: Message sent 11/30  Please schedule this patient for a In person postpartum visit in 6 weeks with the following provider: Any provider. Additional Postpartum F/U:Incision check 1 week and BP check 1 week  High risk pregnancy complicated by: HTN Delivery mode:  C-Section, Low Transverse  Anticipated Birth Control:   POPs   07/05/2022 Manya Silvas, CNM

## 2022-07-03 LAB — COMPREHENSIVE METABOLIC PANEL
ALT: 9 U/L (ref 0–44)
AST: 16 U/L (ref 15–41)
Albumin: 1.8 g/dL — ABNORMAL LOW (ref 3.5–5.0)
Alkaline Phosphatase: 101 U/L (ref 38–126)
Anion gap: 6 (ref 5–15)
BUN: 8 mg/dL (ref 6–20)
CO2: 22 mmol/L (ref 22–32)
Calcium: 8 mg/dL — ABNORMAL LOW (ref 8.9–10.3)
Chloride: 109 mmol/L (ref 98–111)
Creatinine, Ser: 0.61 mg/dL (ref 0.44–1.00)
GFR, Estimated: >60 mL/min (ref >60)
Glucose, Bld: 82 mg/dL (ref 70–99)
Potassium: 3.4 mmol/L — ABNORMAL LOW (ref 3.5–5.1)
Sodium: 137 mmol/L (ref 135–145)
Total Bilirubin: <0.1 mg/dL — ABNORMAL LOW (ref 0.3–1.2)
Total Protein: 4.8 g/dL — ABNORMAL LOW (ref 6.5–8.1)

## 2022-07-03 LAB — CBC
HCT: 27.9 % — ABNORMAL LOW (ref 36.0–46.0)
Hemoglobin: 9.4 g/dL — ABNORMAL LOW (ref 12.0–15.0)
MCH: 29.2 pg (ref 26.0–34.0)
MCHC: 33.7 g/dL (ref 30.0–36.0)
MCV: 86.6 fL (ref 80.0–100.0)
Platelets: 170 10*3/uL (ref 150–400)
RBC: 3.22 MIL/uL — ABNORMAL LOW (ref 3.87–5.11)
RDW: 14.4 % (ref 11.5–15.5)
WBC: 13.1 10*3/uL — ABNORMAL HIGH (ref 4.0–10.5)
nRBC: 0 % (ref 0.0–0.2)

## 2022-07-03 NOTE — Progress Notes (Signed)
POSTPARTUM PROGRESS NOTE  POD #1  Subjective:  Jodi Jenkins is a 26 y.o. G1P1001 s/p pLTCS at [redacted]w[redacted]d.  She reports she doing well. No acute events overnight. She reports she is doing well. She denies any problems with ambulating, voiding or po intake. Denies nausea or vomiting. She has passed flatus. Pain is well controlled.  Lochia is minimal.  Objective: Blood pressure 129/76, pulse 95, temperature 98.6 F (37 C), temperature source Oral, resp. rate 17, height 5\' 2"  (1.575 m), weight 120.2 kg, last menstrual period 10/01/2021, SpO2 95 %, unknown if currently breastfeeding.  Physical Exam:  General: alert, cooperative and no distress Chest: no respiratory distress Heart:regular rate, distal pulses intact Abdomen: soft, nontender,  Uterine Fundus: firm, appropriately tender DVT Evaluation: No calf swelling or tenderness Extremities: Mild non-pitting LE edema Skin: warm, dry; incision clean/dry/ w/ pressure dressing in place  Recent Labs    07/02/22 1053 07/03/22 0612  HGB 11.4* 9.4*  HCT 34.6* 27.9*    Assessment/Plan: Jodi Jenkins is a 26 y.o. G1P1001 s/p pLTCS at [redacted]w[redacted]d for NRNFT.  POD#1 - Doing welll; pain well controlled. H/H appropriate  Routine postpartum care  OOB, ambulated  Lovenox for VTE prophylaxis Anemia: asymptomatic  Start po ferrous sulfate Contraception: Slynd Feeding: Breast  Dispo: Plan for discharge 12/2-12/3.   LOS: 2 days   04-18-1996, DO OB Fellow, Faculty [facility], Center for [facility] 07/03/2022, 1:12 PM

## 2022-07-04 MED ORDER — FUROSEMIDE 20 MG PO TABS
20.0000 mg | ORAL_TABLET | Freq: Two times a day (BID) | ORAL | Status: DC
  Administered 2022-07-04 – 2022-07-05 (×3): 20 mg via ORAL
  Filled 2022-07-04 (×3): qty 1

## 2022-07-04 NOTE — Lactation Note (Addendum)
This note was copied from a baby's chart. Lactation Consultation Note  Patient Name: Jodi Jenkins GYJEH'U Date: 07/04/2022 Reason for consult: Follow-up assessment;Early term 37-38.6wks;Infant < 6lbs;1st time breastfeeding Age:24 hours  P1, Baby [redacted]w[redacted]d.  Baby has primarily been formula feeding.  Mother states she occasionally breastfeeds but has not been pumping.  Encouraged mother to pump after feedings to help establish her milk supply.   Feed on demand with cues at least q 3 hours. Wake baby for feedings if needed.  She states baby wakes q 1.5 hours to feed.  She is receiving volumes of 20-25 ml of formula per feeding with Dr. Theora Gianotti ultra preemie nipple. Suggest calling for latch assistance as needed.    Maternal Data Has patient been taught Hand Expression?: Yes Does the patient have breastfeeding experience prior to this delivery?: No  Feeding Mother's Current Feeding Choice: Breast Milk and Formula  Lactation Tools Discussed/Used Tools: Pump Flange Size: 27 Breast pump type: Double-Electric Breast Pump Reason for Pumping: stimulation and supplementation Pumping frequency: q 3 hours Pumped volume:  (has not been pumping)  Interventions    Discharge Pump: DEBP;Personal (Evenflow)  Consult Status Consult Status: Follow-up Date: 07/04/22 Follow-up type: In-patient    [name] [facility] 07/04/2022, 3:16 PM

## 2022-07-04 NOTE — Progress Notes (Signed)
POSTPARTUM PROGRESS NOTE  Subjective: Jodi Jenkins is a 26 y.o. G1P1001 POD#2 pLTCS at [redacted]w[redacted]d.  She reports she doing well. No acute events overnight. She denies any problems with ambulating, voiding or po intake. She has passed flatus. Pain is well controlled.  Lochia is appropriate.   Objective: Blood pressure 133/73, pulse 91, temperature 98 F (36.7 C), temperature source Oral, resp. rate 17, height 5\' 2"  (1.575 m), weight 120.2 kg, last menstrual period 10/01/2021, SpO2 99 %, unknown if currently breastfeeding.  Physical Exam:  General: alert, cooperative and no distress Chest: no respiratory distress Abdomen: soft, TTP above surgical site, no drainage or bleeding from incision Uterine Fundus: firm and at level of umbilicus Extremities: No calf swelling or tenderness, 2+ pitting edema of bilateral feet  Recent Labs    07/02/22 1053 07/03/22 0612  HGB 11.4* 9.4*  HCT 34.6* 27.9*    Assessment/Plan: Jodi Jenkins is a 26 y.o. G1P1001 s/p pLTCS at [redacted]w[redacted]d for NRFHT.  Routine Postpartum Care: Doing well, pain well-controlled. Will order abdominal binder at patient request. Consider sending oxy 5 mg x5 tablets at discharge for continued pain control. -- Continue routine care, lactation support  -- Contraception: slynd -- Feeding: breast -- Edema: suspect component of third spacing. Consider continuing laxix 20 mg at discharge x5 days.  Dispo: Plan for discharge today or tomorrow.  [redacted]w[redacted]d, MD Faculty Practice, Center for [facility] 07/04/2022 8:03 AM

## 2022-07-05 MED ORDER — POLYETHYLENE GLYCOL 3350 17 GM/SCOOP PO POWD: 17.0000 g | Freq: Every day | ORAL | 2 refills | Status: DC | PRN

## 2022-07-05 MED ORDER — IBUPROFEN 600 MG PO TABS: 600.0000 mg | ORAL_TABLET | Freq: Four times a day (QID) | ORAL | 1 refills | Status: DC | PRN

## 2022-07-05 MED ORDER — GABAPENTIN 300 MG PO CAPS: 300.0000 mg | ORAL_CAPSULE | Freq: Two times a day (BID) | ORAL | 0 refills | Status: DC | PRN

## 2022-07-05 MED ORDER — FUROSEMIDE 20 MG PO TABS
20.0000 mg | ORAL_TABLET | Freq: Two times a day (BID) | ORAL | 0 refills | Status: DC
  Filled 2022-07-05: qty 10, 5d supply, fill #0

## 2022-07-05 MED ORDER — SLYND 4 MG PO TABS: 1.0000 | ORAL_TABLET | Freq: Every day | ORAL | 12 refills | Status: AC

## 2022-07-05 MED ORDER — ACETAMINOPHEN 500 MG PO TABS: 1000.0000 mg | ORAL_TABLET | Freq: Four times a day (QID) | ORAL | 0 refills | Status: DC | PRN

## 2022-07-05 MED ORDER — NIFEDIPINE ER 30 MG PO TB24
30.0000 mg | ORAL_TABLET | Freq: Every day | ORAL | 0 refills | Status: DC
  Filled 2022-07-05: qty 30, 30d supply, fill #0

## 2022-07-05 MED ORDER — IBUPROFEN 600 MG PO TABS
600.0000 mg | ORAL_TABLET | Freq: Four times a day (QID) | ORAL | 0 refills | Status: DC | PRN
  Filled 2022-07-05: qty 30, 8d supply, fill #0

## 2022-07-05 MED ORDER — OXYCODONE HCL 5 MG PO TABS: 5.0000 mg | ORAL_TABLET | ORAL | 0 refills | Status: DC | PRN

## 2022-07-05 MED ORDER — FUROSEMIDE 20 MG PO TABS: 20.0000 mg | ORAL_TABLET | Freq: Two times a day (BID) | ORAL | 0 refills | Status: DC

## 2022-07-05 MED ORDER — FERROUS SULFATE 325 (65 FE) MG PO TABS: 325.0000 mg | ORAL_TABLET | ORAL | 0 refills | Status: AC

## 2022-07-05 MED ORDER — NIFEDIPINE ER 30 MG PO TB24: 30.0000 mg | ORAL_TABLET | Freq: Every day | ORAL | 0 refills | Status: DC

## 2022-07-05 MED ORDER — OXYCODONE HCL 5 MG PO TABS
5.0000 mg | ORAL_TABLET | ORAL | 0 refills | Status: DC | PRN
  Filled 2022-07-05: qty 5, 1d supply, fill #0

## 2022-07-05 NOTE — Progress Notes (Signed)
Set patient up with babyscripts hypertension app per MD order. Patient states she already has a blood pressure cuff at home. Patient entered blood pressure into app. Patient entered most recent blood pressure into app and this RN verified that blood pressure is visible in "episodes of care--OB home device data." Earl Gala, Hetty Blend

## 2022-07-05 NOTE — Lactation Note (Incomplete)
This note was copied from a baby's chart. Lactation Consultation Note  Patient Name: Jodi Jenkins [id number]'Q Date: 07/05/2022 Reason for consult: Follow-up assessment;Primapara;1st time breastfeeding;Early term 37-38.6wks;Infant < 6lbs;Breastfeeding assistance (Baby latched on the Lt Br when LC entered the room with depth and swallows. LC changed a wet / stool and LC assisted to latch on the Rt Br football with depth and fed total 10 mins with swallows.) Age:5 hours  Maternal Data Has patient been taught Hand Expression?: Yes  Feeding Mother's Current Feeding Choice: Breast Milk and Formula Nipple Type: Dr. Lorne Skeens  LATCH Score Latch: Grasps breast easily, tongue down, lips flanged, rhythmical sucking.  Audible Swallowing: Spontaneous and intermittent  Type of Nipple: Everted at rest and after stimulation  Comfort (Breast/Nipple): Soft / non-tender  Hold (Positioning): Assistance needed to correctly position infant at breast and maintain latch.  LATCH Score: 9   Lactation Tools Discussed/Used Tools: Pump;Flanges Flange Size: 27 Breast pump type: Double-Electric Breast Pump Pump Education: Setup, frequency, and cleaning;Milk Storage Pumping frequency: per mom has only pumped few times. LC reviewed the importance of consistent pumping to bring her milk in to make it easier for the baby.  Interventions Interventions: Breast feeding basics reviewed;Assisted with latch;Skin to skin;Breast massage;Hand express;Reverse pressure;Breast compression;Adjust position;Support pillows;Position options;DEBP;Education;LC Services brochure  Discharge Discharge Education: Engorgement and breast care;Warning signs for feeding baby;Outpatient recommendation;Outpatient Epic message sent;Other (comment) Pump: DEBP;Personal  Consult Status Consult Status: Complete Date: 07/05/22 Follow-up type: In-patient    [name] 07/05/2022, 12:18 PM

## 2022-07-06 ENCOUNTER — Other Ambulatory Visit (HOSPITAL_COMMUNITY): Payer: Self-pay

## 2022-07-07 LAB — SURGICAL PATHOLOGY

## 2022-07-08 ENCOUNTER — Other Ambulatory Visit: Payer: BC Managed Care – PPO

## 2022-07-08 ENCOUNTER — Ambulatory Visit: Payer: BC Managed Care – PPO

## 2022-07-09 ENCOUNTER — Encounter: Payer: BC Managed Care – PPO | Admitting: Obstetrics and Gynecology

## 2022-07-10 ENCOUNTER — Ambulatory Visit (INDEPENDENT_AMBULATORY_CARE_PROVIDER_SITE_OTHER): Payer: BC Managed Care – PPO | Admitting: Certified Nurse Midwife

## 2022-07-10 ENCOUNTER — Encounter: Payer: Self-pay | Admitting: Certified Nurse Midwife

## 2022-07-10 ENCOUNTER — Ambulatory Visit: Payer: BC Managed Care – PPO

## 2022-07-10 ENCOUNTER — Other Ambulatory Visit: Payer: BC Managed Care – PPO

## 2022-07-10 VITALS — BP 124/80 | HR 112 | Ht 63.0 in | Wt 253.0 lb

## 2022-07-10 DIAGNOSIS — O135 Gestational [pregnancy-induced] hypertension without significant proteinuria, complicating the puerperium: Secondary | ICD-10-CM

## 2022-07-10 DIAGNOSIS — R300 Dysuria: Secondary | ICD-10-CM

## 2022-07-10 DIAGNOSIS — O139 Gestational [pregnancy-induced] hypertension without significant proteinuria, unspecified trimester: Secondary | ICD-10-CM

## 2022-07-10 NOTE — Progress Notes (Signed)
Subjective:    Jodi Jenkins is a 26 y.o. female who presents for a incision check. She is 9 days  postpartum following a low cervical transverse Cesarean section. She denies redness, drainage, or edema of incision. Her pain is well controlled. Her pregnancy was also complicated by gHTN. She finished the Lasix and is taking Procardia. She c/o pain with urination, no other urinary sx.   Review of Systems Pertinent items are noted in HPI.   Objective:   BP 124/80   Pulse (!) 112   Ht 5\' 3"  (1.6 m)   Wt 253 lb (114.8 kg)   LMP 10/01/2021   BMI 44.82 kg/m   General: alert, cooperative and no distress  Lungs:  Regular WOB  Heart:  Regular rate  Skin:  Low transverse abd incision well healed and approximated, no edema, erythema, or drainage.   Assessment:  S/p Cesarean Section Incision check gHTN  Plan:  Continue Procardia; d/c 3-4 days prior to pp visit, check home BPs Urine culture Return for postpartum visit as scheduled    12/01/2021, CNM  07/10/2022 11:49 AM

## 2022-07-12 LAB — URINE CULTURE
MICRO NUMBER:: 14290059
Result:: NO GROWTH
SPECIMEN QUALITY:: ADEQUATE

## 2022-07-15 ENCOUNTER — Other Ambulatory Visit: Payer: BC Managed Care – PPO

## 2022-07-15 ENCOUNTER — Ambulatory Visit: Payer: BC Managed Care – PPO

## 2022-07-16 ENCOUNTER — Encounter: Payer: BC Managed Care – PPO | Admitting: Obstetrics & Gynecology

## 2022-07-22 ENCOUNTER — Ambulatory Visit (INDEPENDENT_AMBULATORY_CARE_PROVIDER_SITE_OTHER): Payer: BC Managed Care – PPO | Admitting: Obstetrics and Gynecology

## 2022-07-22 ENCOUNTER — Encounter: Payer: BC Managed Care – PPO | Admitting: Obstetrics and Gynecology

## 2022-07-22 ENCOUNTER — Encounter: Payer: Self-pay | Admitting: Obstetrics and Gynecology

## 2022-07-22 NOTE — Patient Instructions (Addendum)
You can stop taking blood pressure medications.  Please have your mom call our office for a behavioral health referral.  Apply a warm washcloth to the affected area 2-3 times per day for the next 2 weeks.

## 2022-07-22 NOTE — Progress Notes (Signed)
    Post Partum Visit Note  Jodi Jenkins is a 26 y.o. G62P1001 female who presents for a postpartum visit. She is 3 weeks postpartum following a primary cesarean section.  I have fully reviewed the prenatal and intrapartum course. The delivery was at 37 gestational weeks.  Anesthesia: regional. Postpartum course has been okay - concerns about incision & vaginal lump. Baby is doing well. Had an RSV scare. Baby is feeding by breast and formula. Bleeding thin lochia. Bowel function is normal. Bladder function is normal. Patient is not sexually active. Contraception method is oral progesterone-only contraceptive. Postpartum depression screening: deferred - verbally screened and denies any personal mood concerns. Feels like she is doing well emotionally, but has concerns about her moms mental health    Health Maintenance Due  Topic Date Due   HPV VACCINES (1 - 2-dose series) Never done   INFLUENZA VACCINE  03/03/2022   COVID-19 Vaccine (3 - 2023-24 season) 04/03/2022    The following portions of the patient's history were reviewed and updated as appropriate: allergies, current medications, past family history, past medical history, past social history, past surgical history, and problem list.  Review of Systems Pertinent items are noted in HPI.  Objective:  BP 123/82   Pulse 87   Ht 5\' 2"  (1.575 m)   Wt 248 lb (112.5 kg)   LMP 10/01/2021   Breastfeeding Yes   BMI 45.36 kg/m    General:  alert and no distress   Breasts:  not indicated  Lungs: Normal resp effort  Heart:  Normal peripheral perfusion  Abdomen: soft, non-tender; bowel sounds normal; no masses,  no organomegaly   Wound well approximated incision, 3cm area on left lateral aspect of incision that is mildly indurated w/ no overlying erythema or warmth  GU exam:   1cm left labial/bartholin's cyst  Pelvic exam performed in presence of chaperone      Assessment:    Normal postpartum exam.   Plan:   Essential components  of care per ACOG recommendations:  1.  Mood and well being: Patient with negative depression screening today. Reviewed local resources for support.  - Patient tobacco use? No.   - hx of drug use? No.    2. Infant care and feeding:  -Patient currently breastmilk feeding? Yes. Discussed returning to work and pumping.  -Social determinants of health (SDOH) reviewed in EPIC. No concerns  3. Sexuality, contraception and birth spacing - Patient does want a pregnancy in the next year.  Desired family size is 2 children.  - Reviewed reproductive life planning. Reviewed contraceptive methods based on pt preferences and effectiveness.  Patient desired  today.   - Discussed birth spacing of 18 months  4. Sleep and fatigue -Encouraged family/partner/community support of 4 hrs of uninterrupted sleep to help with mood and fatigue  5. Physical Recovery  - Discussed patients delivery and complications. She describes her labor as mixed. - Patient had a C-section.  - Patient has urinary incontinence? No. - Patient is safe to resume physical and sexual activity  6.  Health Maintenance - HM due items addressed Yes - Last pap smear NILM/HPV negative 2021 -Breast Cancer screening indicated? No.   7. Chronic Disease/Pregnancy Condition follow up:  - gHTN: normotensive today, no issues. Can stop BP medications - PCP follow up  2022, MD Center for [facility], [facility]

## 2022-07-30 ENCOUNTER — Ambulatory Visit: Payer: BC Managed Care – PPO | Admitting: Obstetrics and Gynecology

## 2024-03-14 IMAGING — US US OB COMP LESS 14 WK
1 series · 15 of 19 positions shown · non-contrast
Comparison: None Available.

CLINICAL DATA: Pain left lower quadrant of abdomen

EXAM:
OBSTETRIC <14 WK ULTRASOUND
TECHNIQUE: Transabdominal ultrasound was performed for evaluation of the
gestation as well as the maternal uterus and adnexal regions.

[Series 1: us ob comp less 14 wk · 15 of 19 slices shown]
[im 1/19]
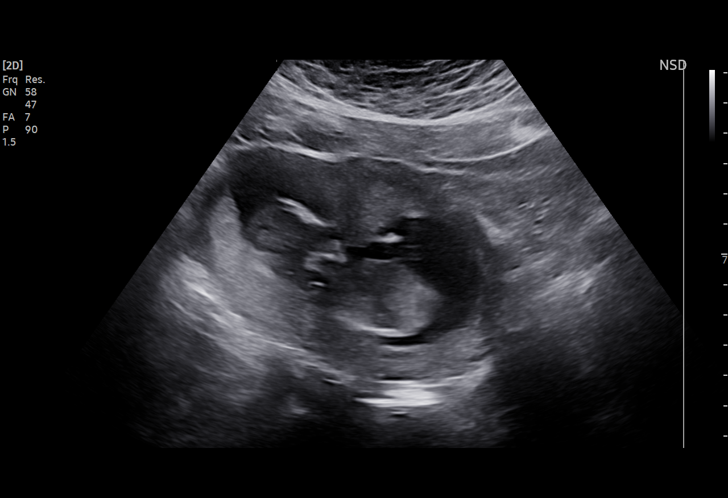
[im 2/19]
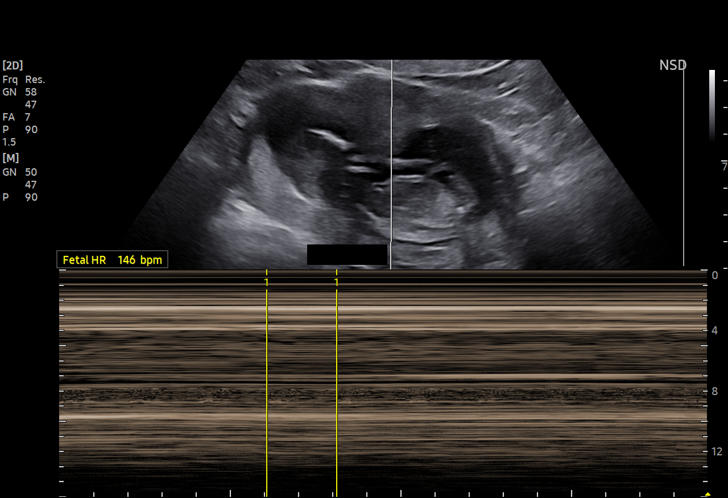
[im 4/19]
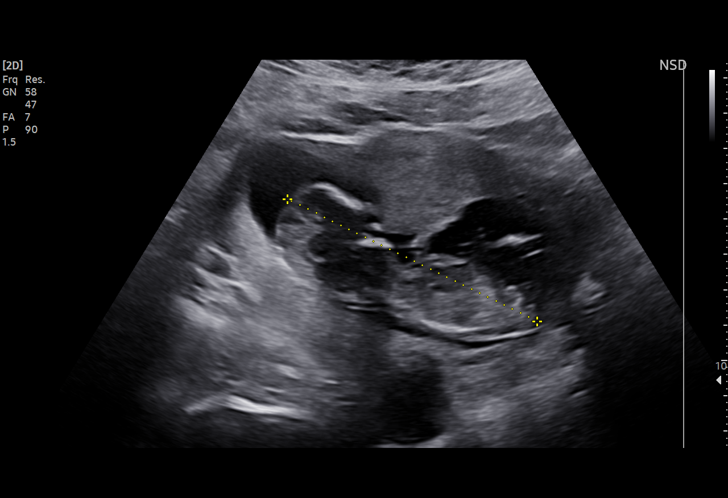
[im 5/19]
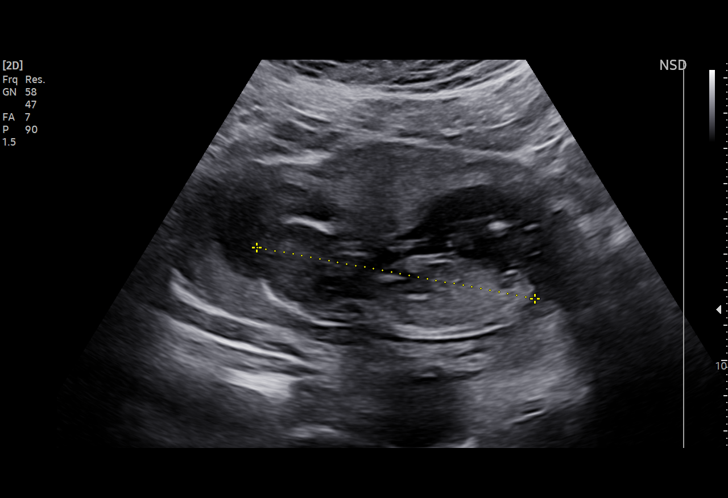
[im 6/19]
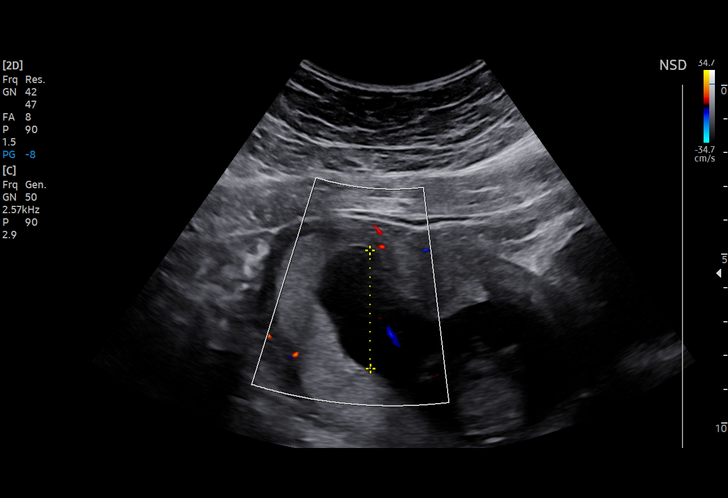
[im 7/19]
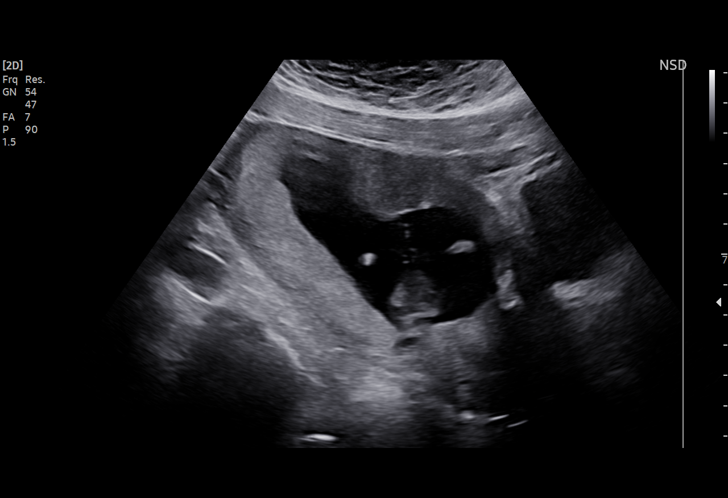
[im 9/19]
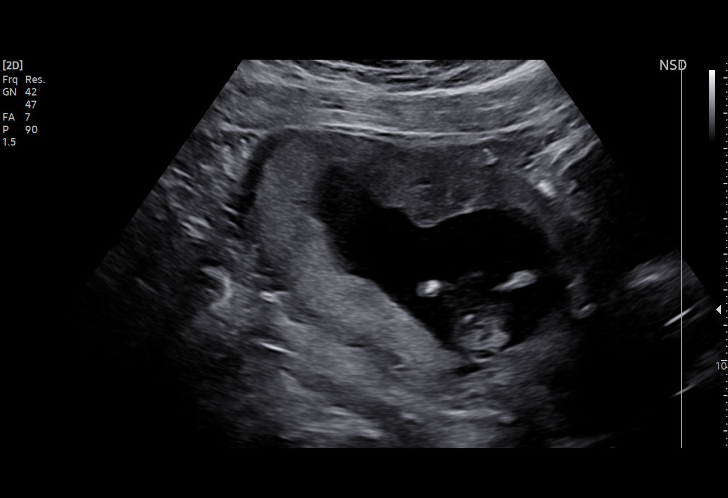
[im 10/19]
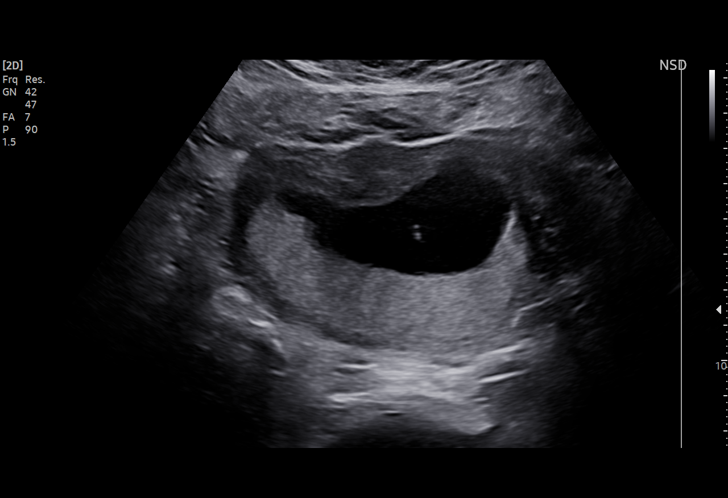
[im 11/19]
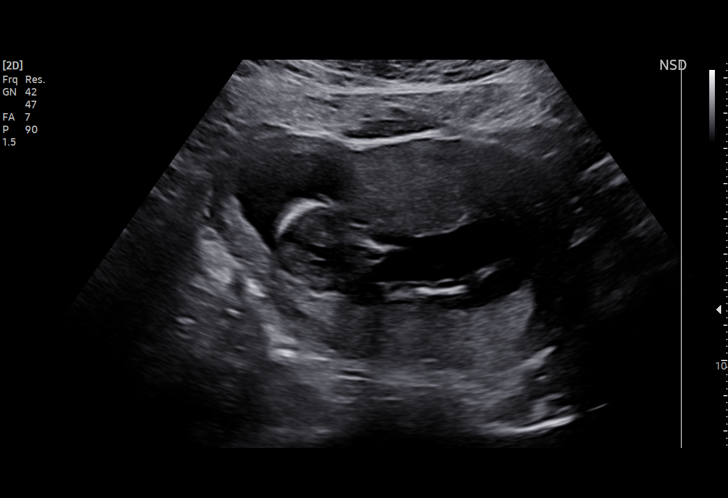
[im 13/19]
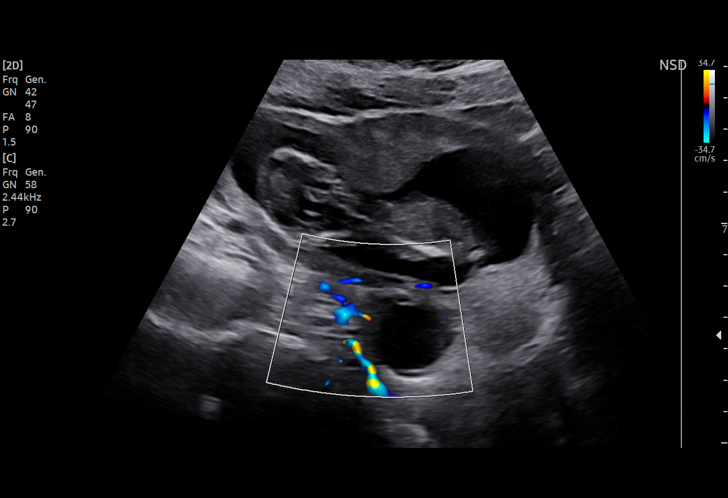
[im 14/19]
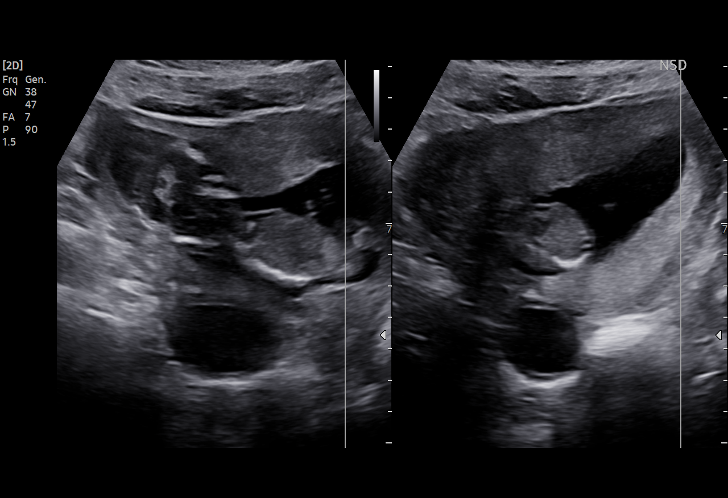
[im 15/19]
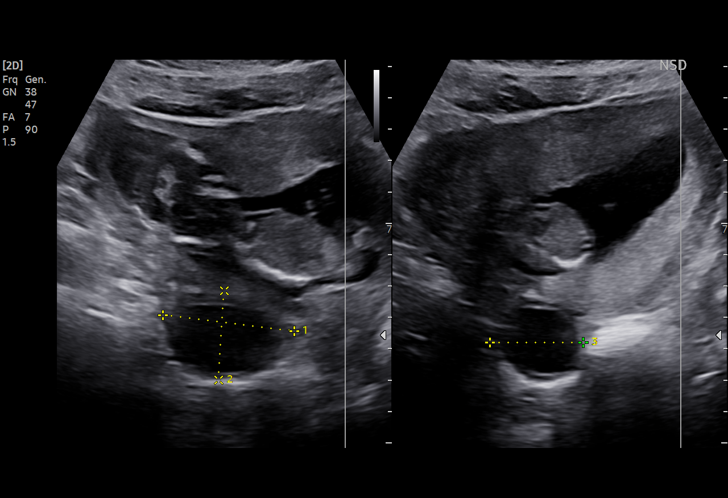
[im 16/19]
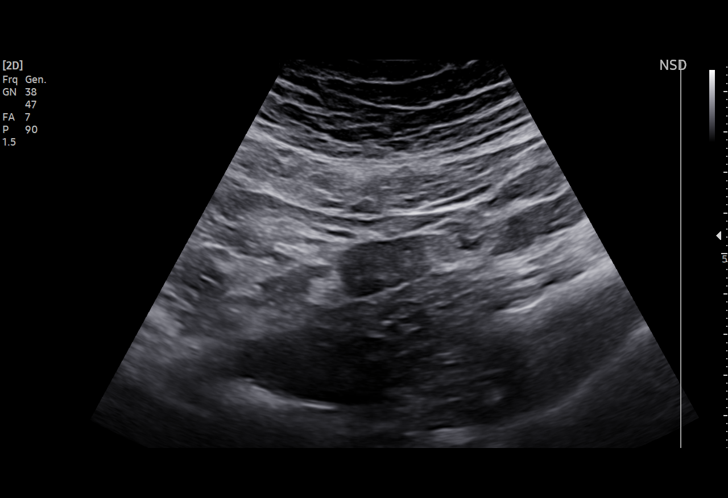
[im 18/19]
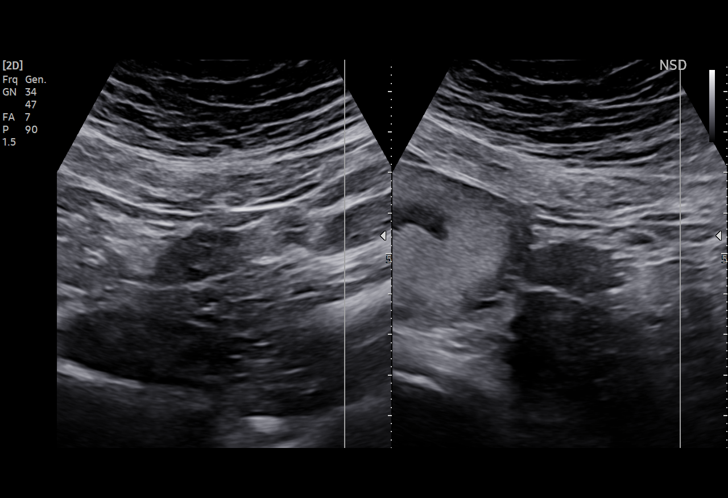
[im 19/19]
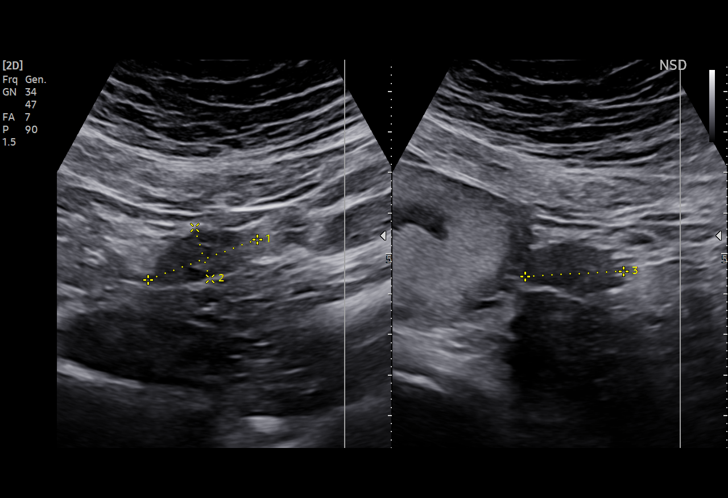

[15 of 19 positions shown; findings below may reference images not displayed]

FINDINGS: Intrauterine gestational sac: Single

Yolk sac:  Not seen

Embryo:  Seen

Cardiac Activity: Seen

Heart Rate: 140 bpm

CRL:   80 mm   14 w 0 d                  US EDC: 07/18/2022

Placenta is posterior without evidence of previa or abruption.
Amount of amniotic fluid around the fetus appears to be normal.

Subchorionic hemorrhage:  None visualized.

Maternal uterus/adnexae: There is 3.5 cm cyst in the right ovary,
possibly functional cyst. Adnexal regions are otherwise
unremarkable. There is no free fluid in the pelvis.
IMPRESSION: Single live intrauterine pregnancy seen. Sonographically estimated
gestational age is 14 weeks.

3.5 cm cyst in the right adnexa suggests functional ovarian cyst.
There is no free fluid in the pelvis.
